# Patient Record
Sex: Female | Born: 2000 | Race: Black or African American | Hispanic: No | Marital: Single | Smoking: Current some day smoker
Health system: Southern US, Community
[De-identification: ages and names within clinical notes are randomized; demographics above are authoritative.]

## PROBLEM LIST (undated history)

## (undated) ENCOUNTER — Ambulatory Visit: Payer: Self-pay | Source: Home / Self Care

## (undated) DIAGNOSIS — Z789 Other specified health status: Secondary | ICD-10-CM

## (undated) HISTORY — PX: NO PAST SURGERIES: SHX2092

## (undated) HISTORY — DX: Other specified health status: Z78.9

---
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
## 2019-04-19 ENCOUNTER — Telehealth: Payer: Self-pay | Admitting: Family Medicine

## 2019-04-19 NOTE — Telephone Encounter (Signed)
Received after-hours page.  Call patient back.  No answer.

## 2019-12-11 ENCOUNTER — Other Ambulatory Visit: Payer: Self-pay

## 2019-12-25 ENCOUNTER — Other Ambulatory Visit: Payer: Self-pay

## 2020-12-17 ENCOUNTER — Ambulatory Visit (HOSPITAL_COMMUNITY)
Admission: EM | Admit: 2020-12-17 | Discharge: 2020-12-17 | Disposition: A | Discharge status: Home / Self Care | Payer: Medicaid Other | Attending: Physician Assistant | Admitting: Physician Assistant

## 2020-12-17 ENCOUNTER — Other Ambulatory Visit: Payer: Self-pay

## 2020-12-17 ENCOUNTER — Encounter (HOSPITAL_COMMUNITY): Payer: Self-pay | Admitting: Emergency Medicine

## 2020-12-17 ENCOUNTER — Ambulatory Visit (INDEPENDENT_AMBULATORY_CARE_PROVIDER_SITE_OTHER): Payer: Medicaid Other

## 2020-12-17 DIAGNOSIS — R0781 Pleurodynia: Secondary | ICD-10-CM | POA: Diagnosis not present

## 2020-12-17 DIAGNOSIS — Z3202 Encounter for pregnancy test, result negative: Secondary | ICD-10-CM

## 2020-12-17 DIAGNOSIS — R0782 Intercostal pain: Secondary | ICD-10-CM | POA: Diagnosis not present

## 2020-12-17 DIAGNOSIS — R079 Chest pain, unspecified: Secondary | ICD-10-CM | POA: Diagnosis not present

## 2020-12-17 LAB — POC URINE PREG, ED: Preg Test, Ur: NEGATIVE

## 2020-12-17 MED ORDER — PREDNISONE 20 MG PO TABS: 40.0000 mg | ORAL_TABLET | Freq: Every day | ORAL | 0 refills | Status: AC

## 2020-12-17 NOTE — ED Triage Notes (Signed)
Pt c/o right rib pain for couple days. Denies injury, coughing, or recent vomiting.  Pt reports keeps spotting after cycles. Has Nexplonon and expired in May.

## 2020-12-17 NOTE — ED Provider Notes (Signed)
[facility]    CSN: [id number] Arrival date & time: 12/17/20  1139      History   Chief Complaint Chief Complaint  Patient presents with   Chest Pain    HPI Lisa Booth is a 20 y.o. female.   Patient presents today with a 3 day history of right chest/rib pain.  She denies any known injury or increase in activity prior to symptom onset.  Reports pain is rated 9 on a 0-10 pain scale, localized to right inferior lateral rib cage, described as sharp, worse with sneezing/coughing.  She has not tried any over-the-counter medications for symptom management.  She reports pain is interfering with her ability perform daily tibias.  Pain is not superficial and she has no tenderness palpation; reports that she feels that it is deep.  She has no concern for pregnancy but is open to testing.  She has a Nexplanon but does not take any exogenous estrogen.  She denies personal or family history of VTE event.  Denies any recent hospitalization, COVID-19 infection, immobilization.  She denies any significant shortness of breath, nausea, vomiting, headache, dizziness.   Past Medical History:  Diagnosis Date   Medical history non-contributory     Patient Active Problem List   Diagnosis Date Noted   Normal labor and delivery 08/11/2017   SVD (spontaneous vaginal delivery) 08/11/2017   Fetal cardiac anomaly affecting pregnancy, antepartum 08/01/2017   Supervision of normal first teen pregnancy 07/24/2017   Limited prenatal care in third trimester 07/24/2017   High risk teen pregnancy in third trimester 07/24/2017    Past Surgical History:  Procedure Laterality Date   NO PAST SURGERIES      OB History     Gravida  1   Para  1   Term  1   Preterm      AB      Living  1      SAB      IAB      Ectopic      Multiple  0   Live Births  1            Home Medications    Prior to Admission medications   Medication Sig Start Date End Date Taking? Authorizing  Provider  predniSONE (DELTASONE) 20 MG tablet Take 2 tablets (40 mg total) by mouth daily for 4 days. 12/17/20 12/21/20 Yes Zackory Pudlo, Noberto Retort, PA-C  Prenatal MV & Min w/FA-DHA (PRENATAL ADULT GUMMY/DHA/FA) 0.4-25 MG CHEW Chew 2 tablets by mouth daily.    [provider]    Family History History reviewed. No pertinent family history.  Social History Social History   Tobacco Use   Smoking status: Former   Smokeless tobacco: Never  Substance Use Topics   Alcohol use: Never   Drug use: Yes    Types: Marijuana    Comment: denies     Allergies   Patient has no known allergies.   Review of Systems Review of Systems  Constitutional:  Positive for activity change. Negative for appetite change, diaphoresis, fatigue and fever.  Respiratory:  Negative for cough and shortness of breath.   Cardiovascular:  Positive for chest pain (right rib). Negative for palpitations and leg swelling.  Gastrointestinal:  Negative for abdominal pain, diarrhea, nausea and vomiting.  Neurological:  Negative for dizziness, weakness, light-headedness and headaches.    Physical Exam Triage Vital Signs ED Triage Vitals  Enc Vitals Group     BP 12/17/20 1317 107/70  Pulse Rate 12/17/20 1317 80     Resp 12/17/20 1317 16     Temp 12/17/20 1317 99.1 F (37.3 C)     Temp Source 12/17/20 1317 Oral     SpO2 12/17/20 1317 98 %     Weight --      Height --      Head Circumference --      Peak Flow --      Pain Score 12/17/20 1315 9     Pain Loc --      Pain Edu? --      Excl. in GC? --    No data found.  Updated Vital Signs BP 107/70 (BP Location: Right Arm)   Pulse 80   Temp 99.1 F (37.3 C) (Oral)   Resp 16   SpO2 98%   Visual Acuity Right Eye Distance:   Left Eye Distance:   Bilateral Distance:    Right Eye Near:   Left Eye Near:    Bilateral Near:     Physical Exam Vitals reviewed.  Constitutional:      General: She is awake. She is not in acute distress.    Appearance:  Normal appearance. She is well-developed. She is not ill-appearing.     Comments: Very pleasant female appears stated age no acute distress sitting comfortably in exam room  HENT:     Head: Normocephalic and atraumatic.     Mouth/Throat:     Pharynx: Uvula midline. No oropharyngeal exudate or posterior oropharyngeal erythema.  Cardiovascular:     Rate and Rhythm: Normal rate and regular rhythm.     Heart sounds: Normal heart sounds, S1 normal and S2 normal. No murmur heard. Pulmonary:     Effort: Pulmonary effort is normal.     Breath sounds: Normal breath sounds. No wheezing, rhonchi or rales.     Comments: Clear to auscultation bilaterally Chest:     Chest wall: No deformity, swelling or tenderness.     Comments: Pain is not reproducible on exam.  No significant tenderness palpation over chest wall.  No deformity noted. Abdominal:     Palpations: Abdomen is soft.     Tenderness: There is no abdominal tenderness. There is no rebound.  Psychiatric:        Behavior: Behavior is cooperative.     UC Treatments / Results  Labs (all labs ordered are listed, but only abnormal results are displayed) Labs Reviewed  POC URINE PREG, ED    EKG   Radiology DG Ribs Unilateral W/Chest Right  Result Date: 12/17/2020 CLINICAL DATA:  Right rib pain, no known injury EXAM: RIGHT RIBS AND CHEST - 3+ VIEW COMPARISON:  None. FINDINGS: No fracture or other bone lesions are seen involving the ribs. There is no evidence of pneumothorax or pleural effusion. Both lungs are clear. Heart size and mediastinal contours are within normal limits. IMPRESSION: No displaced rib fractures or other radiographic abnormality to explain pain. No acute abnormality of the lungs. Electronically Signed   By: Lauralyn Primes M.D.   On: 12/17/2020 14:08    Procedures Procedures (including critical care time)  Medications Ordered in UC Medications - No data to display  Initial Impression / Assessment and Plan / UC Course   I have reviewed the triage vital signs and the nursing notes.  Pertinent labs & imaging results that were available during my care of the patient were reviewed by me and considered in my medical decision making (see chart for details).  EKG obtained showed no ischemic changes with normal sinus rhythm and ventricular rate of 68 bpm; no previous to compare.  Chest x-ray was normal.  Urine pregnancy test was negative.  Discussed that symptoms could be related to pleuritis so we will start prednisone to help manage symptoms.  She was instructed not to take NSAIDs with this medication due to risk of GI bleeding.  Vital signs are reassuring today and patient has no risk factors for PE but we discussed if she has any worsening symptoms including palpitations, chest pain, shortness of breath she is to go to the emergency room for further evaluation and management.  Discussed alarm symptoms that warrant emergent evaluation.  Strict return precautions given to which she expressed understanding.  Final Clinical Impressions(s) / UC Diagnoses   Final diagnoses:  Right-sided chest pain     Discharge Instructions      Your EKG and chest x-ray were normal.  I believe that you have some inflammation of the lining of your lungs which could be causing your pain.  We are going to treat this with an anti-inflammatory known as prednisone.  Please take this morning for 4 days.  Do not take NSAIDs including aspirin, ibuprofen/Advil, naproxen/Aleve with this medication.  If your symptoms do not improve or if at any point you have worsening chest pain, shortness of breath you need to go to the emergency room.  Follow-up with your primary care provider within a week to ensure symptom improvement unless need to be seen in the emergency room sooner.  Call OB/GYN to schedule an appointment to have Nexplanon removed/replaced.     ED Prescriptions     Medication Sig Dispense Auth. Provider   predniSONE (DELTASONE)  20 MG tablet Take 2 tablets (40 mg total) by mouth daily for 4 days. 8 tablet Midge Momon, Noberto Retort, PA-C      PDMP not reviewed this encounter.   Jeani Hawking, PA-C 12/17/20 1502

## 2020-12-17 NOTE — ED Notes (Signed)
Discharged by other. 

## 2020-12-17 NOTE — Discharge Instructions (Addendum)
Your EKG and chest x-ray were normal.  I believe that you have some inflammation of the lining of your lungs which could be causing your pain.  We are going to treat this with an anti-inflammatory known as prednisone.  Please take this morning for 4 days.  Do not take NSAIDs including aspirin, ibuprofen/Advil, naproxen/Aleve with this medication.  If your symptoms do not improve or if at any point you have worsening chest pain, shortness of breath you need to go to the emergency room.  Follow-up with your primary care provider within a week to ensure symptom improvement unless need to be seen in the emergency room sooner.  Call OB/GYN to schedule an appointment to have Nexplanon removed/replaced.

## 2020-12-22 ENCOUNTER — Other Ambulatory Visit: Payer: Self-pay

## 2020-12-22 ENCOUNTER — Emergency Department (HOSPITAL_COMMUNITY)
Admission: EM | Admit: 2020-12-22 | Discharge: 2020-12-23 | Disposition: A | Discharge status: Home / Self Care | Payer: Medicaid Other | Attending: Emergency Medicine | Admitting: Emergency Medicine

## 2020-12-22 ENCOUNTER — Encounter (HOSPITAL_COMMUNITY): Payer: Self-pay

## 2020-12-22 DIAGNOSIS — X58XXXA Exposure to other specified factors, initial encounter: Secondary | ICD-10-CM | POA: Diagnosis not present

## 2020-12-22 DIAGNOSIS — R1011 Right upper quadrant pain: Secondary | ICD-10-CM | POA: Diagnosis not present

## 2020-12-22 DIAGNOSIS — R109 Unspecified abdominal pain: Secondary | ICD-10-CM | POA: Insufficient documentation

## 2020-12-22 DIAGNOSIS — S299XXA Unspecified injury of thorax, initial encounter: Secondary | ICD-10-CM | POA: Diagnosis not present

## 2020-12-22 DIAGNOSIS — Z5321 Procedure and treatment not carried out due to patient leaving prior to being seen by health care provider: Secondary | ICD-10-CM | POA: Insufficient documentation

## 2020-12-22 DIAGNOSIS — R0781 Pleurodynia: Secondary | ICD-10-CM | POA: Diagnosis not present

## 2020-12-22 LAB — URINALYSIS, ROUTINE W REFLEX MICROSCOPIC
Bilirubin Urine: NEGATIVE
Glucose, UA: NEGATIVE mg/dL
Hgb urine dipstick: NEGATIVE
Ketones, ur: 5 mg/dL — AB
Nitrite: NEGATIVE
Protein, ur: NEGATIVE mg/dL
Specific Gravity, Urine: 1.025 (ref 1.005–1.030)
pH: 6 (ref 5.0–8.0)

## 2020-12-22 LAB — CBC WITH DIFFERENTIAL/PLATELET
Abs Immature Granulocytes: 0.06 10*3/uL (ref 0.00–0.07)
Basophils Absolute: 0.1 10*3/uL (ref 0.0–0.1)
Basophils Relative: 1 %
Eosinophils Absolute: 0.1 10*3/uL (ref 0.0–0.5)
Eosinophils Relative: 1 %
HCT: 40 % (ref 36.0–46.0)
Hemoglobin: 13.4 g/dL (ref 12.0–15.0)
Immature Granulocytes: 1 %
Lymphocytes Relative: 24 %
Lymphs Abs: 2.8 10*3/uL (ref 0.7–4.0)
MCH: 32.8 pg (ref 26.0–34.0)
MCHC: 33.5 g/dL (ref 30.0–36.0)
MCV: 97.8 fL (ref 80.0–100.0)
Monocytes Absolute: 1.1 10*3/uL — ABNORMAL HIGH (ref 0.1–1.0)
Monocytes Relative: 9 %
Neutro Abs: 7.9 10*3/uL — ABNORMAL HIGH (ref 1.7–7.7)
Neutrophils Relative %: 64 %
Platelets: 367 10*3/uL (ref 150–400)
RBC: 4.09 MIL/uL (ref 3.87–5.11)
RDW: 12.2 % (ref 11.5–15.5)
WBC: 12 10*3/uL — ABNORMAL HIGH (ref 4.0–10.5)
nRBC: 0 % (ref 0.0–0.2)

## 2020-12-22 LAB — BASIC METABOLIC PANEL
Anion gap: 10 (ref 5–15)
BUN: 12 mg/dL (ref 6–20)
CO2: 25 mmol/L (ref 22–32)
Calcium: 8.8 mg/dL — ABNORMAL LOW (ref 8.9–10.3)
Chloride: 100 mmol/L (ref 98–111)
Creatinine, Ser: 0.73 mg/dL (ref 0.44–1.00)
GFR, Estimated: >60 mL/min (ref >60)
Glucose, Bld: 87 mg/dL (ref 70–99)
Potassium: 2.9 mmol/L — ABNORMAL LOW (ref 3.5–5.1)
Sodium: 135 mmol/L (ref 135–145)

## 2020-12-22 LAB — I-STAT BETA HCG BLOOD, ED (MC, WL, AP ONLY): I-stat hCG, quantitative: <5 m[IU]/mL (ref <5)

## 2020-12-22 LAB — LACTIC ACID, PLASMA: Lactic Acid, Venous: 0.9 mmol/L (ref 0.5–1.9)

## 2020-12-22 LAB — LIPASE, BLOOD: Lipase: 33 U/L (ref 11–51)

## 2020-12-22 NOTE — ED Provider Notes (Signed)
Emergency Medicine Provider Triage Evaluation Note  Lisa Booth , a 20 y.o. female  was evaluated in triage.  Pt complains of right-sided rib pain over the last 5 days.  She was initially seen and evaluated urgent care and had negative rib x-rays and heart work-up per the patient.  She reports associated nausea denies any vomiting, chills, fever.  No urinary complaints or vaginal complaints.  Review of Systems  Positive:  Negative: See above   Physical Exam  BP 111/82 (BP Location: Right Arm)   Pulse (!) 103   Temp (!) 100.4 F (38 C) (Oral)   Resp 17   Ht 5\' 4"  (1.626 m)   Wt 45.8 kg   LMP 12/08/2020 (Approximate)   SpO2 96%   BMI 17.34 kg/m  Gen:   Awake, no distress   Resp:  Normal effort  MSK:   Moves extremities without difficulty  Other:  Moderate tenderness in the right upper quadrant with minimal palpation with mild tenderness throughout the rest the abdomen.  Negative peritoneal signs at this time.  Medical Decision Making  Medically screening exam initiated at 9:51 PM.  Appropriate orders placed.  MISKI FELDPAUSCH was informed that the remainder of the evaluation will be completed by another provider, this initial triage assessment does not replace that evaluation, and the importance of remaining in the ED until their evaluation is complete.     Lisa Booth 12/22/20 2153    2154, MD 12/22/20 2224

## 2020-12-22 NOTE — ED Triage Notes (Addendum)
Pt reports right rib cage pain xd 4 days; was seen at Urgent Care 5 days ago and prescribed a pain med but cant remember what the name of the med. Took last dose last night and the pain is not getting better. PA at triage and patient is tender to palpation to right abdomen.

## 2020-12-23 MED ORDER — ACETAMINOPHEN 325 MG PO TABS
650.0000 mg | ORAL_TABLET | Freq: Once | ORAL | Status: AC
  Administered 2020-12-23: 650 mg via ORAL
  Filled 2020-12-23: qty 2

## 2020-12-23 NOTE — ED Notes (Signed)
Pt is requesting pain medication. 

## 2020-12-23 NOTE — ED Notes (Signed)
Pt left. 

## 2020-12-23 NOTE — ED Notes (Signed)
Name called for IV start x 1 - no answer

## 2020-12-26 ENCOUNTER — Telehealth: Payer: Self-pay

## 2020-12-26 NOTE — Telephone Encounter (Signed)
Transition Care Management Unsuccessful Follow-up Telephone Call  Date of discharge and from where:  12/23/2020-[facility]   Attempts:  1st Attempt  Reason for unsuccessful TCM follow-up call:  Unable to leave message

## 2020-12-27 NOTE — Telephone Encounter (Signed)
Transition Care Management Follow-up Telephone Call Date of discharge and from where: [facility] How have you been since you were released from the hospital? Doing much better Any questions or concerns? No  Items Reviewed: Did the pt receive and understand the discharge instructions provided? Yes  Medications obtained and verified? Yes  Other? No  Any new allergies since your discharge? No  Dietary orders reviewed? Yes Do you have support at home? Yes   Home Care and Equipment/Supplies: Were home health services ordered? not applicable If so, what is the name of the agency?   Has the agency set up a time to come to the patient's home? not applicable Were any new equipment or medical supplies ordered?   What is the name of the medical supply agency?  Were you able to get the supplies/equipment? not applicable Do you have any questions related to the use of the equipment or supplies? No  Functional Questionnaire: (I = Independent and D = Dependent) ADLs: I  Bathing/Dressing- I  Meal Prep- I    Eating- I  Maintaining continence- I  Transferring/Ambulation- I  Managing Meds- I  Follow up appointments reviewed:  PCP Hospital f/u appt confirmed? Yes  I advised patient to find out which PCP was on her card and call them to follow up. Pt agreed.  Specialist Hospital f/u appt confirmed? N/A Are transportation arrangements needed? No  If their condition worsens, is the pt aware to call PCP or go to the Emergency Dept.? Yes Was the patient provided with contact information for the PCP's office or ED? Yes Was to pt encouraged to call back with questions or concerns? Yes

## 2021-06-01 ENCOUNTER — Other Ambulatory Visit: Payer: Self-pay

## 2021-06-01 ENCOUNTER — Encounter: Payer: Self-pay | Admitting: Emergency Medicine

## 2021-06-01 ENCOUNTER — Ambulatory Visit
Admission: EM | Admit: 2021-06-01 | Discharge: 2021-06-01 | Disposition: A | Discharge status: Home / Self Care | Payer: Medicaid Other | Attending: Physician Assistant | Admitting: Physician Assistant

## 2021-06-01 DIAGNOSIS — Z202 Contact with and (suspected) exposure to infections with a predominantly sexual mode of transmission: Secondary | ICD-10-CM | POA: Diagnosis not present

## 2021-06-01 DIAGNOSIS — Z113 Encounter for screening for infections with a predominantly sexual mode of transmission: Secondary | ICD-10-CM

## 2021-06-01 LAB — POCT URINE PREGNANCY: Preg Test, Ur: NEGATIVE

## 2021-06-01 NOTE — ED Triage Notes (Signed)
Here for urine pregnancy as well as STD swabs, declines wanting blood work.  ?

## 2021-06-01 NOTE — ED Provider Notes (Signed)
?[name] ? ? ? ?CSN: [id number] ?Arrival date & time: 06/01/21  1248 ? ? ?  ? ?History   ?Chief Complaint ?No chief complaint on file. ? ? ?HPI ?Lisa Booth is a 21 y.o. female.  ? ?Here today for STD screening.  She also requests urine pregnancy test if possible.  She declines blood work at this time.  She denies any current symptoms or known exposure.  ? ?The history is provided by the patient.  ? ?Past Medical History:  ?Diagnosis Date  ? Medical history non-contributory   ? ? ?Patient Active Problem List  ? Diagnosis Date Noted  ? Normal labor and delivery 08/11/2017  ? SVD (spontaneous vaginal delivery) 08/11/2017  ? Fetal cardiac anomaly affecting pregnancy, antepartum 08/01/2017  ? Supervision of normal first teen pregnancy 07/24/2017  ? Limited prenatal care in third trimester 07/24/2017  ? High risk teen pregnancy in third trimester 07/24/2017  ? ? ?Past Surgical History:  ?Procedure Laterality Date  ? NO PAST SURGERIES    ? ? ?OB History   ? ? Gravida  ?1  ? Para  ?1  ? Term  ?1  ? Preterm  ?   ? AB  ?   ? Living  ?1  ?  ? ? SAB  ?   ? IAB  ?   ? Ectopic  ?   ? Multiple  ?0  ? Live Births  ?1  ?   ?  ?  ? ? ? ?Home Medications   ? ?Prior to Admission medications   ?Medication Sig Start Date End Date Taking? Authorizing Provider  ?Prenatal MV & Min w/FA-DHA (PRENATAL ADULT GUMMY/DHA/FA) 0.4-25 MG CHEW Chew 2 tablets by mouth daily.    [provider]  ? ? ?Family History ?History reviewed. No pertinent family history. ? ?Social History ?Social History  ? ?Tobacco Use  ? Smoking status: Former  ? Smokeless tobacco: Never  ?Substance Use Topics  ? Alcohol use: Never  ? Drug use: Yes  ?  Types: Marijuana  ?  Comment: denies  ? ? ? ?Allergies   ?Patient has no known allergies. ? ? ?Review of Systems ?Review of Systems  ?Constitutional:  Negative for chills and fever.  ?Eyes:  Negative for discharge and redness.  ?Gastrointestinal:  Negative for abdominal pain, nausea and vomiting.   ?Genitourinary:  Negative for vaginal discharge.  ? ? ?Physical Exam ?Triage Vital Signs ?ED Triage Vitals  ?Enc Vitals Group  ?   BP   ?   Pulse   ?   Resp   ?   Temp   ?   Temp src   ?   SpO2   ?   Weight   ?   Height   ?   Head Circumference   ?   Peak Flow   ?   Pain Score   ?   Pain Loc   ?   Pain Edu?   ?   Excl. in GC?   ? ?No data found. ? ?Updated Vital Signs ?BP 115/72 (BP Location: Right Arm)   Pulse 94   Temp 98.2 ?F (36.8 ?C) (Oral)   Resp 16   SpO2 99%  ? ?Physical Exam ?Vitals and nursing note reviewed.  ?Constitutional:   ?   General: She is not in acute distress. ?   Appearance: Normal appearance. She is not ill-appearing.  ?HENT:  ?   Head: Normocephalic and atraumatic.  ?Eyes:  ?  Conjunctiva/sclera: Conjunctivae normal.  ?Cardiovascular:  ?   Rate and Rhythm: Normal rate.  ?Pulmonary:  ?   Effort: Pulmonary effort is normal.  ?Neurological:  ?   Mental Status: She is alert.  ?Psychiatric:     ?   Mood and Affect: Mood normal.     ?   Behavior: Behavior normal.     ?   Thought Content: Thought content normal.  ? ? ? ?UC Treatments / Results  ?Labs ?(all labs ordered are listed, but only abnormal results are displayed) ?Labs Reviewed  ?POCT URINE PREGNANCY  ?CERVICOVAGINAL ANCILLARY ONLY  ? ? ?EKG ? ? ?Radiology ?No results found. ? ?Procedures ?Procedures (including critical care time) ? ?Medications Ordered in UC ?Medications - No data to display ? ?Initial Impression / Assessment and Plan / UC Course  ?I have reviewed the triage vital signs and the nursing notes. ? ?Pertinent labs & imaging results that were available during my care of the patient were reviewed by me and considered in my medical decision making (see chart for details). ? ?  ?STD screening ordered. Urine pregnancy test negative. Will await results for further recommendation. Encouraged follow up with any further concerns.  ? ?Final Clinical Impressions(s) / UC Diagnoses  ? ?Final diagnoses:  ?Screening for STD (sexually  transmitted disease)  ? ?Discharge Instructions   ?None ?  ? ?ED Prescriptions   ?None ?  ? ?PDMP not reviewed this encounter. ?  ?Tomi Bamberger, PA-C ?06/01/21 1403 ? ?

## 2021-06-02 ENCOUNTER — Telehealth (HOSPITAL_COMMUNITY): Payer: Self-pay | Admitting: Emergency Medicine

## 2021-06-02 LAB — CERVICOVAGINAL ANCILLARY ONLY
Bacterial Vaginitis (gardnerella): POSITIVE — AB
Candida Glabrata: NEGATIVE
Candida Vaginitis: NEGATIVE
Chlamydia: POSITIVE — AB
Comment: NEGATIVE
Comment: NEGATIVE
Comment: NEGATIVE
Comment: NEGATIVE
Comment: NEGATIVE
Comment: NORMAL
Neisseria Gonorrhea: POSITIVE — AB
Trichomonas: POSITIVE — AB

## 2021-06-02 MED ORDER — METRONIDAZOLE 500 MG PO TABS: 500.0000 mg | ORAL_TABLET | Freq: Two times a day (BID) | ORAL | 0 refills | Status: DC

## 2021-06-02 MED ORDER — DOXYCYCLINE HYCLATE 100 MG PO CAPS: 100.0000 mg | ORAL_CAPSULE | Freq: Two times a day (BID) | ORAL | 0 refills | Status: AC

## 2021-06-02 NOTE — Telephone Encounter (Signed)
Per protocol, patient will need treatment with IM ROcephin 500mg  for positive Gonorrhea.  Will also need treatment with Doxycycline, and Metronidazole.   ?Attempted to reach patient x 1, unable to LVM on all three numbers on chart ?Prescription sent to pharmacy on file ?

## 2021-06-07 ENCOUNTER — Ambulatory Visit
Admission: EM | Admit: 2021-06-07 | Discharge: 2021-06-07 | Disposition: A | Discharge status: Home / Self Care | Payer: Medicaid Other | Attending: Internal Medicine | Admitting: Internal Medicine

## 2021-06-07 ENCOUNTER — Ambulatory Visit: Payer: Self-pay

## 2021-06-07 DIAGNOSIS — Z202 Contact with and (suspected) exposure to infections with a predominantly sexual mode of transmission: Secondary | ICD-10-CM

## 2021-06-07 MED ORDER — CEFTRIAXONE SODIUM 1 G IJ SOLR
0.5000 g | Freq: Once | INTRAMUSCULAR | Status: AC
  Administered 2021-06-07: 0.5 g via INTRAMUSCULAR

## 2021-06-07 NOTE — ED Triage Notes (Signed)
Patient here for nurse visit of Rocehpin 500mg . ?

## 2021-06-30 ENCOUNTER — Ambulatory Visit
Admission: EM | Admit: 2021-06-30 | Discharge: 2021-06-30 | Disposition: A | Discharge status: Home / Self Care | Payer: Medicaid Other | Attending: Student | Admitting: Student

## 2021-06-30 DIAGNOSIS — Z3202 Encounter for pregnancy test, result negative: Secondary | ICD-10-CM | POA: Diagnosis not present

## 2021-06-30 DIAGNOSIS — Z113 Encounter for screening for infections with a predominantly sexual mode of transmission: Secondary | ICD-10-CM | POA: Diagnosis not present

## 2021-06-30 LAB — POCT URINALYSIS DIP (MANUAL ENTRY)
Bilirubin, UA: NEGATIVE
Glucose, UA: NEGATIVE mg/dL
Ketones, POC UA: NEGATIVE mg/dL
Leukocytes, UA: NEGATIVE
Nitrite, UA: NEGATIVE
Spec Grav, UA: 1.02 (ref 1.010–1.025)
Urobilinogen, UA: 2 E.U./dL — AB
pH, UA: 8.5 — AB (ref 5.0–8.0)

## 2021-06-30 LAB — POCT URINE PREGNANCY: Preg Test, Ur: NEGATIVE

## 2021-06-30 NOTE — Discharge Instructions (Addendum)
-  We have sent testing for sexually transmitted infections. We will notify you of any positive results once they are received. If required, we will prescribe any medications you might need. Please refrain from all sexual activity until treatment is complete.  -Seek additional medical attention if you develop fevers/chills, new/worsening abdominal pain, new/worsening vaginal discomfort/discharge, etc.   

## 2021-06-30 NOTE — ED Triage Notes (Signed)
Pt states here for routine sti check and pregnancy testing. Denies sxs or notice of exposure. Also requests pregnancy test.  ? ?Pt states breasts are still producing milk despite child not feeding x 3 years  ?

## 2021-06-30 NOTE — ED Provider Notes (Signed)
?[name] ? ? ? ?CSN: [id number] ?Arrival date & time: 06/30/21  1928 ? ? ?  ? ?History   ?Chief Complaint ?Chief Complaint  ?Patient presents with  ? sti screening  ? ? ?HPI ?Lisa Booth is a 21 y.o. female presenting for routine STI check and pregnancy test.  Denies symptoms.  Does note that her breasts are still producing trace amounts of milk, that she has not breast-fed in 3 years. Denies hematuria, dysuria, frequency, urgency, back pain, n/v/d/abd pain, fevers/chills, abdnormal vaginal discharge. Denies headaches, vision changes, dizziness. Denies new partners. ? ? ?HPI ? ?Past Medical History:  ?Diagnosis Date  ? Medical history non-contributory   ? ? ?Patient Active Problem List  ? Diagnosis Date Noted  ? Normal labor and delivery 08/11/2017  ? SVD (spontaneous vaginal delivery) 08/11/2017  ? Fetal cardiac anomaly affecting pregnancy, antepartum 08/01/2017  ? Supervision of normal first teen pregnancy 07/24/2017  ? Limited prenatal care in third trimester 07/24/2017  ? High risk teen pregnancy in third trimester 07/24/2017  ? ? ?Past Surgical History:  ?Procedure Laterality Date  ? NO PAST SURGERIES    ? ? ?OB History   ? ? Gravida  ?1  ? Para  ?1  ? Term  ?1  ? Preterm  ?   ? AB  ?   ? Living  ?1  ?  ? ? SAB  ?   ? IAB  ?   ? Ectopic  ?   ? Multiple  ?0  ? Live Births  ?1  ?   ?  ?  ? ? ? ?Home Medications   ? ?Prior to Admission medications   ?Not on File  ? ? ?Family History ?History reviewed. No pertinent family history. ? ?Social History ?Social History  ? ?Tobacco Use  ? Smoking status: Former  ? Smokeless tobacco: Never  ?Substance Use Topics  ? Alcohol use: Never  ? Drug use: Yes  ?  Types: Marijuana  ?  Comment: denies  ? ? ? ?Allergies   ?Patient has no known allergies. ? ? ?Review of Systems ?Review of Systems  ?Constitutional:  Negative for chills and fever.  ?HENT:  Negative for sore throat.   ?Eyes:  Negative for pain and redness.  ?Respiratory:  Negative for shortness of  breath.   ?Cardiovascular:  Negative for chest pain.  ?Gastrointestinal:  Negative for abdominal pain, diarrhea, nausea and vomiting.  ?Genitourinary:  Negative for decreased urine volume, difficulty urinating, dysuria, flank pain, frequency, genital sores, hematuria, urgency and vaginal discharge.  ?Musculoskeletal:  Negative for back pain.  ?Skin:  Negative for rash.  ?All other systems reviewed and are negative. ? ? ?Physical Exam ?Triage Vital Signs ?ED Triage Vitals [06/30/21 1934]  ?Enc Vitals Group  ?   BP 107/68  ?   Pulse Rate 85  ?   Resp 18  ?   Temp 98 ?F (36.7 ?C)  ?   Temp Source Oral  ?   SpO2 98 %  ?   Weight   ?   Height   ?   Head Circumference   ?   Peak Flow   ?   Pain Score 0  ?   Pain Loc   ?   Pain Edu?   ?   Excl. in GC?   ? ?No data found. ? ?Updated Vital Signs ?BP 107/68 (BP Location: Left Arm)   Pulse 85   Temp 98 ?F (36.7 ?C) (Oral)  Resp 18   SpO2 98%   Breastfeeding No  ? ?Visual Acuity ?Right Eye Distance:   ?Left Eye Distance:   ?Bilateral Distance:   ? ?Right Eye Near:   ?Left Eye Near:    ?Bilateral Near:    ? ?Physical Exam ?Vitals reviewed.  ?Constitutional:   ?   General: She is not in acute distress. ?   Appearance: Normal appearance. She is not ill-appearing.  ?HENT:  ?   Head: Normocephalic and atraumatic.  ?   Mouth/Throat:  ?   Mouth: Mucous membranes are moist.  ?   Comments: Moist mucous membranes ?Eyes:  ?   Extraocular Movements: Extraocular movements intact.  ?   Pupils: Pupils are equal, round, and reactive to light.  ?Cardiovascular:  ?   Rate and Rhythm: Normal rate and regular rhythm.  ?   Heart sounds: Normal heart sounds.  ?Pulmonary:  ?   Effort: Pulmonary effort is normal.  ?   Breath sounds: Normal breath sounds. No wheezing, rhonchi or rales.  ?Abdominal:  ?   General: Bowel sounds are normal. There is no distension.  ?   Palpations: Abdomen is soft. There is no mass.  ?   Tenderness: There is no abdominal tenderness. There is no right CVA tenderness,  left CVA tenderness, guarding or rebound.  ?Genitourinary: ?   Comments: deferred ?Skin: ?   General: Skin is warm.  ?   Capillary Refill: Capillary refill takes less than 2 seconds.  ?   Comments: Good skin turgor  ?Neurological:  ?   General: No focal deficit present.  ?   Mental Status: She is alert and oriented to person, place, and time.  ?Psychiatric:     ?   Mood and Affect: Mood normal.     ?   Behavior: Behavior normal.  ? ? ? ?UC Treatments / Results  ?Labs ?(all labs ordered are listed, but only abnormal results are displayed) ?Labs Reviewed  ?POCT URINALYSIS DIP (MANUAL ENTRY) - Abnormal; Notable for the following components:  ?    Result Value  ? Color, UA other (*)   ? Blood, UA trace-intact (*)   ? pH, UA 8.5 (*)   ? Protein Ur, POC trace (*)   ? Urobilinogen, UA 2.0 (*)   ? All other components within normal limits  ?POCT URINE PREGNANCY  ?CERVICOVAGINAL ANCILLARY ONLY  ? ? ?EKG ? ? ?Radiology ?No results found. ? ?Procedures ?Procedures (including critical care time) ? ?Medications Ordered in UC ?Medications - No data to display ? ?Initial Impression / Assessment and Plan / UC Course  ?I have reviewed the triage vital signs and the nursing notes. ? ?Pertinent labs & imaging results that were available during my care of the patient were reviewed by me and considered in my medical decision making (see chart for details). ? ?  ? ?This patient is a very pleasant 21 y.o. year old female presenting with STI screen and pregnancy test - asymptomatic.  ? ?U-preg negative. ?Asymptomatic, denies new partners. Will send self-swab for G/C, trich, yeast, BV testing. Declines HIV, RPR. Safe sex precautions.  ? 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  Recommended following up with OB/GYN to check hormone levels. ? ?ED return precautions discussed. Patient verbalizes understanding and  agreement.  ? ?.  ? ?Final Clinical Impressions(s) / UC Diagnoses  ? ?Final diagnoses:  ?  Negative pregnancy test  ?Routine screening for STI (sexually transmitted infection)  ? ? ? ?Discharge Instructions   ? ?  ?-We have sent testing for sexually transmitted infections. We will notify you of any positive results once they are received. If required, we will prescribe any medications you might need. Please refrain from all sexual activity until treatment is complete.  ?-Seek additional medical attention if you develop fevers/chills, new/worsening abdominal pain, new/worsening vaginal discomfort/discharge, etc.  ? ? ? ? ? ?ED Prescriptions   ?None ?  ? ?PDMP not reviewed this encounter. ?  ?Rhys MartiniGraham, Amelda Hapke E, PA-C ?06/30/21 1954 ? ?

## 2021-07-03 ENCOUNTER — Telehealth (HOSPITAL_COMMUNITY): Payer: Self-pay | Admitting: Emergency Medicine

## 2021-07-03 LAB — CERVICOVAGINAL ANCILLARY ONLY
Bacterial Vaginitis (gardnerella): POSITIVE — AB
Candida Glabrata: POSITIVE — AB
Candida Vaginitis: NEGATIVE
Chlamydia: POSITIVE — AB
Comment: NEGATIVE
Comment: NEGATIVE
Comment: NEGATIVE
Comment: NEGATIVE
Comment: NEGATIVE
Comment: NORMAL
Neisseria Gonorrhea: POSITIVE — AB
Trichomonas: POSITIVE — AB

## 2021-07-03 MED ORDER — DOXYCYCLINE HYCLATE 100 MG PO CAPS: 100.0000 mg | ORAL_CAPSULE | Freq: Two times a day (BID) | ORAL | 0 refills | Status: AC

## 2021-07-03 MED ORDER — METRONIDAZOLE 500 MG PO TABS: 500.0000 mg | ORAL_TABLET | Freq: Two times a day (BID) | ORAL | 0 refills | Status: DC

## 2021-07-03 MED ORDER — FLUCONAZOLE 150 MG PO TABS: 150.0000 mg | ORAL_TABLET | Freq: Once | ORAL | 0 refills | Status: AC

## 2021-07-03 NOTE — Telephone Encounter (Signed)
Per protocol, patient will need treatment with IM Rocephin 500mg  for positive Gonorrhea.  Will also need treatment with Doxycycline, Metronidazole and diflucan.   ?Contacted patient by phone.  Verified identity using two identifiers.  Provided positive result.  Reviewed safe sex practices, notifying partners, and refraining from sexual activities for 7 days from time of treatment.  Patient verified understanding, all questions answered.   ?HHS notified ?Verified pharmacy, prescription sent ?

## 2021-07-04 ENCOUNTER — Ambulatory Visit
Admission: RE | Admit: 2021-07-04 | Discharge: 2021-07-04 | Disposition: A | Discharge status: Home / Self Care | Payer: Medicaid Other | Source: Ambulatory Visit | Attending: Physician Assistant | Admitting: Physician Assistant

## 2021-07-04 DIAGNOSIS — A549 Gonococcal infection, unspecified: Secondary | ICD-10-CM | POA: Diagnosis not present

## 2021-07-04 MED ORDER — CEFTRIAXONE SODIUM 1 G IJ SOLR
0.5000 g | Freq: Once | INTRAMUSCULAR | Status: AC
  Administered 2021-07-04: 0.5 g via INTRAMUSCULAR

## 2021-07-11 NOTE — ED Triage Notes (Signed)
Pt presents for treatment for gonorrhea.  ?

## 2021-08-02 ENCOUNTER — Ambulatory Visit: Payer: Medicaid Other | Admitting: Family Medicine

## 2021-08-09 ENCOUNTER — Ambulatory Visit
Admission: EM | Admit: 2021-08-09 | Discharge: 2021-08-09 | Discharge status: Against Medical Advice | Payer: Medicaid Other

## 2021-08-09 ENCOUNTER — Ambulatory Visit
Admission: EM | Admit: 2021-08-09 | Discharge: 2021-08-09 | Disposition: A | Discharge status: Home / Self Care | Payer: Medicaid Other | Attending: Urgent Care | Admitting: Urgent Care

## 2021-08-09 ENCOUNTER — Encounter: Payer: Self-pay | Admitting: Emergency Medicine

## 2021-08-09 DIAGNOSIS — A749 Chlamydial infection, unspecified: Secondary | ICD-10-CM | POA: Diagnosis not present

## 2021-08-09 DIAGNOSIS — A5901 Trichomonal vulvovaginitis: Secondary | ICD-10-CM | POA: Insufficient documentation

## 2021-08-09 MED ORDER — TINIDAZOLE 500 MG PO TABS: 2.0000 g | ORAL_TABLET | Freq: Every day | ORAL | 0 refills | Status: AC

## 2021-08-09 MED ORDER — AZITHROMYCIN 500 MG PO TABS
1000.0000 mg | ORAL_TABLET | Freq: Once | ORAL | Status: AC
  Administered 2021-08-09: 1000 mg via ORAL

## 2021-08-09 NOTE — Discharge Instructions (Signed)
You received the treatment for gonorrhea on 07/04/21. ?You were given 1g of Azithromycin today to cover for chlamydia which was positive in April. ?You have been prescribed tinidazole to cover for trichomonas and yeast. You will take 4 tabs in a single dose today and 4 tabs in a single dose tomorrow.  ?PLEASE USE [email or web address] for this medication, it is not covered by your prescription plan. ?We will call with the results of your swab ?

## 2021-08-09 NOTE — ED Triage Notes (Signed)
Pt is present today for STD check. Pt denies any sx  

## 2021-08-09 NOTE — ED Provider Notes (Signed)
?[name] ? ? ? ?CSN: [id number] ?Arrival date & time: 08/09/21  1322 ? ? ?  ? ?History   ?Chief Complaint ?Chief Complaint  ?Patient presents with  ? SEXUALLY TRANSMITTED DISEASE  ? ? ?HPI ?TARHONDA HOLLENBERG is a 21 y.o. female.  ? ?21yo female presents today with concerns for untreated STI. Was seen in April, was positive for GC/chlam/trich/BV/ yeast. States she came back on 4/11 and got rocephin. Only took maybe half of the doxy and flagyl, then forgot. Denies any current symptoms, but states she never had any symptoms in April either. Is wanting to receive "shorter treatments" since compliance is an issue.  ? ? ? ?Past Medical History:  ?Diagnosis Date  ? Medical history non-contributory   ? ? ?Patient Active Problem List  ? Diagnosis Date Noted  ? Normal labor and delivery 08/11/2017  ? SVD (spontaneous vaginal delivery) 08/11/2017  ? Fetal cardiac anomaly affecting pregnancy, antepartum 08/01/2017  ? Supervision of normal first teen pregnancy 07/24/2017  ? Limited prenatal care in third trimester 07/24/2017  ? High risk teen pregnancy in third trimester 07/24/2017  ? ? ?Past Surgical History:  ?Procedure Laterality Date  ? NO PAST SURGERIES    ? ? ?OB History   ? ? Gravida  ?1  ? Para  ?1  ? Term  ?1  ? Preterm  ?   ? AB  ?   ? Living  ?1  ?  ? ? SAB  ?   ? IAB  ?   ? Ectopic  ?   ? Multiple  ?0  ? Live Births  ?1  ?   ?  ?  ? ? ? ?Home Medications   ? ?Prior to Admission medications   ?Medication Sig Start Date End Date Taking? Authorizing Provider  ?tinidazole (TINDAMAX) 500 MG tablet Take 4 tablets (2,000 mg total) by mouth daily with breakfast for 2 days. 08/09/21 08/11/21 Yes [place] Cilento L, PA  ? ? ?Family History ?History reviewed. No pertinent family history. ? ?Social History ?Social History  ? ?Tobacco Use  ? Smoking status: Former  ? Smokeless tobacco: Never  ?Substance Use Topics  ? Alcohol use: Never  ? Drug use: Yes  ?  Types: Marijuana  ?  Comment: denies  ? ? ? ?Allergies   ?Patient  has no known allergies. ? ? ?Review of Systems ?Review of Systems ?As per hpi ? ?Physical Exam ?Triage Vital Signs ?ED Triage Vitals  ?Enc Vitals Group  ?   BP 08/09/21 1347 108/74  ?   Pulse Rate 08/09/21 1347 95  ?   Resp 08/09/21 1347 16  ?   Temp 08/09/21 1347 98.2 ?F (36.8 ?C)  ?   Temp src --   ?   SpO2 08/09/21 1347 99 %  ?   Weight --   ?   Height --   ?   Head Circumference --   ?   Peak Flow --   ?   Pain Score 08/09/21 1346 0  ?   Pain Loc --   ?   Pain Edu? --   ?   Excl. in GC? --   ? ?No data found. ? ?Updated Vital Signs ?BP 108/74   Pulse 95   Temp 98.2 ?F (36.8 ?C)   Resp 16   LMP 08/07/2021   SpO2 99%  ? ?Visual Acuity ?Right Eye Distance:   ?Left Eye Distance:   ?Bilateral Distance:   ? ?Right  Eye Near:   ?Left Eye Near:    ?Bilateral Near:    ? ?Physical Exam ?Vitals and nursing note reviewed.  ?Constitutional:   ?   General: She is not in acute distress. ?   Appearance: Normal appearance. She is normal weight. She is not ill-appearing, toxic-appearing or diaphoretic.  ?HENT:  ?   Head: Normocephalic and atraumatic.  ?Abdominal:  ?   General: Abdomen is flat. Bowel sounds are normal. There is no distension.  ?   Palpations: Abdomen is soft.  ?   Tenderness: There is no abdominal tenderness. There is no right CVA tenderness, left CVA tenderness or guarding.  ?Lymphadenopathy:  ?   Cervical: No cervical adenopathy.  ?Skin: ?   General: Skin is warm and dry.  ?   Capillary Refill: Capillary refill takes less than 2 seconds.  ?   Findings: No erythema or rash.  ?Neurological:  ?   Mental Status: She is alert.  ? ? ? ?UC Treatments / Results  ?Labs ?(all labs ordered are listed, but only abnormal results are displayed) ?Labs Reviewed  ?CERVICOVAGINAL ANCILLARY ONLY  ? ? ?EKG ? ? ?Radiology ?No results found. ? ?Procedures ?Procedures (including critical care time) ? ?Medications Ordered in UC ?Medications  ?azithromycin (ZITHROMAX) tablet 1,000 mg (1,000 mg Oral Given 08/09/21 1429)  ? ? ?Initial  Impression / Assessment and Plan / UC Course  ?I have reviewed the triage vital signs and the nursing notes. ? ?Pertinent labs & imaging results that were available during my care of the patient were reviewed by me and considered in my medical decision making (see chart for details). ? ?  ? ?Chlamydia - pt positive for chlamydia last month, did not complete tx. Is requesting one time dose. Understands this is not the preferred tx, will consider test of cure next week ?Trichomonas - pt did not complete flagyl course in April. Will do two days of tinidazole to help with compliance.  ? ?Final Clinical Impressions(s) / UC Diagnoses  ? ?Final diagnoses:  ?Chlamydia infection  ?Trichomonas vaginitis  ? ? ? ?Discharge Instructions   ? ?  ?You received the treatment for gonorrhea on 07/04/21. ?You were given 1g of Azithromycin today to cover for chlamydia which was positive in April. ?You have been prescribed tinidazole to cover for trichomonas and yeast. You will take 4 tabs in a single dose today and 4 tabs in a single dose tomorrow.  ?PLEASE USE [email or web address] for this medication, it is not covered by your prescription plan. ?We will call with the results of your swab ? ? ? ?ED Prescriptions   ? ? Medication Sig Dispense Auth. Provider  ? tinidazole (TINDAMAX) 500 MG tablet Take 4 tablets (2,000 mg total) by mouth daily with breakfast for 2 days. 8 tablet [place], [place] L, PA  ? ?  ? ?PDMP not reviewed this encounter. ?  Maretta Bees, [place] ?08/09/21 1516 ? ?

## 2021-08-10 ENCOUNTER — Telehealth (HOSPITAL_COMMUNITY): Payer: Self-pay | Admitting: Emergency Medicine

## 2021-08-10 LAB — CERVICOVAGINAL ANCILLARY ONLY
Bacterial Vaginitis (gardnerella): NEGATIVE
Candida Glabrata: POSITIVE — AB
Candida Vaginitis: POSITIVE — AB
Chlamydia: NEGATIVE
Comment: NEGATIVE
Comment: NEGATIVE
Comment: NEGATIVE
Comment: NEGATIVE
Comment: NEGATIVE
Comment: NORMAL
Neisseria Gonorrhea: NEGATIVE
Trichomonas: NEGATIVE

## 2021-08-10 MED ORDER — FLUCONAZOLE 150 MG PO TABS: 150.0000 mg | ORAL_TABLET | Freq: Once | ORAL | 0 refills | Status: AC

## 2021-10-10 ENCOUNTER — Ambulatory Visit
Admission: EM | Admit: 2021-10-10 | Discharge: 2021-10-10 | Disposition: A | Discharge status: Home / Self Care | Payer: Medicaid Other | Attending: Physician Assistant | Admitting: Physician Assistant

## 2021-10-10 ENCOUNTER — Other Ambulatory Visit: Payer: Self-pay

## 2021-10-10 ENCOUNTER — Encounter: Payer: Self-pay | Admitting: Emergency Medicine

## 2021-10-10 DIAGNOSIS — Z202 Contact with and (suspected) exposure to infections with a predominantly sexual mode of transmission: Secondary | ICD-10-CM | POA: Diagnosis present

## 2021-10-10 LAB — POCT URINALYSIS DIP (MANUAL ENTRY)
Bilirubin, UA: NEGATIVE
Glucose, UA: NEGATIVE mg/dL
Ketones, POC UA: NEGATIVE mg/dL
Leukocytes, UA: NEGATIVE
Nitrite, UA: NEGATIVE
Protein Ur, POC: NEGATIVE mg/dL
Spec Grav, UA: 1.025 (ref 1.010–1.025)
Urobilinogen, UA: 0.2 E.U./dL
pH, UA: 5.5 (ref 5.0–8.0)

## 2021-10-10 LAB — POCT URINE PREGNANCY: Preg Test, Ur: NEGATIVE

## 2021-10-10 MED ORDER — CEFTRIAXONE SODIUM 1 G IJ SOLR
0.5000 g | Freq: Once | INTRAMUSCULAR | Status: AC
  Administered 2021-10-10: 0.5 g via INTRAMUSCULAR

## 2021-10-10 MED ORDER — DOXYCYCLINE HYCLATE 100 MG PO CAPS: 100.0000 mg | ORAL_CAPSULE | Freq: Two times a day (BID) | ORAL | 0 refills | Status: DC

## 2021-10-10 NOTE — Discharge Instructions (Addendum)
Return if any problems.

## 2021-10-10 NOTE — ED Triage Notes (Signed)
Spotting 4 days last week started period today.  Patient wants std testing and pregnancy test.

## 2021-10-12 ENCOUNTER — Telehealth (HOSPITAL_COMMUNITY): Payer: Self-pay | Admitting: Emergency Medicine

## 2021-10-12 LAB — CERVICOVAGINAL ANCILLARY ONLY
Bacterial Vaginitis (gardnerella): POSITIVE — AB
Candida Glabrata: NEGATIVE
Candida Vaginitis: NEGATIVE
Chlamydia: POSITIVE — AB
Comment: NEGATIVE
Comment: NEGATIVE
Comment: NEGATIVE
Comment: NEGATIVE
Comment: NEGATIVE
Comment: NORMAL
Neisseria Gonorrhea: NEGATIVE
Trichomonas: NEGATIVE

## 2021-10-12 MED ORDER — METRONIDAZOLE 500 MG PO TABS: 500.0000 mg | ORAL_TABLET | Freq: Two times a day (BID) | ORAL | 0 refills | Status: DC

## 2021-10-12 NOTE — ED Provider Notes (Signed)
[name]    CSN: [id number] Arrival date & time: 10/10/21  1742      History   Chief Complaint Chief Complaint  Patient presents with   SEXUALLY TRANSMITTED DISEASE    HPI Lisa Booth is a 21 y.o. female.   Pt request std testing and a pregnancy test.  Pt is concerned about std exposure  The history is provided by the patient. No language interpreter was used.    Past Medical History:  Diagnosis Date   Medical history non-contributory     Patient Active Problem List   Diagnosis Date Noted   Normal labor and delivery 08/11/2017   SVD (spontaneous vaginal delivery) 08/11/2017   Fetal cardiac anomaly affecting pregnancy, antepartum 08/01/2017   Supervision of normal first teen pregnancy 07/24/2017   Limited prenatal care in third trimester 07/24/2017   High risk teen pregnancy in third trimester 07/24/2017    Past Surgical History:  Procedure Laterality Date   NO PAST SURGERIES      OB History     Gravida  1   Para  1   Term  1   Preterm      AB      Living  1      SAB      IAB      Ectopic      Multiple  0   Live Births  1            Home Medications    Prior to Admission medications   Medication Sig Start Date End Date Taking? Authorizing Provider  doxycycline (VIBRAMYCIN) 100 MG capsule Take 1 capsule (100 mg total) by mouth 2 (two) times daily. 10/10/21  Yes Cheron Schaumann K, PA-C  metroNIDAZOLE (FLAGYL) 500 MG tablet Take 1 tablet (500 mg total) by mouth 2 (two) times daily. 10/12/21   Lamptey, Britta Mccreedy, MD    Family History History reviewed. No pertinent family history.  Social History Social History   Tobacco Use   Smoking status: Former   Smokeless tobacco: Never  Building services engineer Use: Never used  Substance Use Topics   Alcohol use: Yes   Drug use: Yes    Types: Marijuana    Comment: denies     Allergies   Patient has no known allergies.   Review of Systems Review of Systems   Genitourinary:  Positive for vaginal discharge.  All other systems reviewed and are negative.    Physical Exam Triage Vital Signs ED Triage Vitals  Enc Vitals Group     BP 10/10/21 1806 109/67     Pulse Rate 10/10/21 1806 97     Resp 10/10/21 1806 18     Temp 10/10/21 1806 98.6 F (37 C)     Temp Source 10/10/21 1806 Oral     SpO2 10/10/21 1806 98 %     Weight --      Height --      Head Circumference --      Peak Flow --      Pain Score 10/10/21 1803 0     Pain Loc --      Pain Edu? --      Excl. in GC? --    No data found.  Updated Vital Signs BP 109/67 (BP Location: Left Arm)   Pulse 97   Temp 98.6 F (37 C) (Oral)   Resp 18   SpO2 98%   Visual Acuity Right Eye Distance:  Left Eye Distance:   Bilateral Distance:    Right Eye Near:   Left Eye Near:    Bilateral Near:     Physical Exam Vitals and nursing note reviewed.  Constitutional:      Appearance: She is well-developed.  HENT:     Head: Normocephalic.  Cardiovascular:     Rate and Rhythm: Normal rate.  Pulmonary:     Effort: Pulmonary effort is normal.  Abdominal:     General: There is no distension.  Musculoskeletal:        General: Normal range of motion.     Cervical back: Normal range of motion.  Skin:    General: Skin is warm.  Neurological:     General: No focal deficit present.     Mental Status: She is alert and oriented to person, place, and time.      UC Treatments / Results  Labs (all labs ordered are listed, but only abnormal results are displayed) Labs Reviewed  POCT URINALYSIS DIP (MANUAL ENTRY) - Abnormal; Notable for the following components:      Result Value   Blood, UA moderate (*)    All other components within normal limits  CERVICOVAGINAL ANCILLARY ONLY - Abnormal; Notable for the following components:   Chlamydia Positive (*)    Bacterial Vaginitis (gardnerella) Positive (*)    All other components within normal limits  POCT URINE PREGNANCY     EKG   Radiology No results found.  Procedures Procedures (including critical care time)  Medications Ordered in UC Medications  cefTRIAXone (ROCEPHIN) injection 0.5 g (0.5 g Intramuscular Given 10/10/21 1837)    Initial Impression / Assessment and Plan / UC Course  I have reviewed the triage vital signs and the nursing notes.  Pertinent labs & imaging results that were available during my care of the patient were reviewed by me and considered in my medical decision making (see chart for details).      Final Clinical Impressions(s) / UC Diagnoses   Final diagnoses:  STD exposure     Discharge Instructions      Return if any problems.    ED Prescriptions     Medication Sig Dispense Auth. Provider   doxycycline (VIBRAMYCIN) 100 MG capsule Take 1 capsule (100 mg total) by mouth 2 (two) times daily. 20 capsule Elson Areas, [place]      PDMP not reviewed this encounter.   Elson Areas, [place] 10/12/21 2224

## 2021-12-04 ENCOUNTER — Ambulatory Visit
Admission: EM | Admit: 2021-12-04 | Discharge: 2021-12-04 | Disposition: A | Discharge status: Home / Self Care | Payer: Medicaid Other | Attending: Physician Assistant | Admitting: Physician Assistant

## 2021-12-04 DIAGNOSIS — Z202 Contact with and (suspected) exposure to infections with a predominantly sexual mode of transmission: Secondary | ICD-10-CM | POA: Diagnosis not present

## 2021-12-04 DIAGNOSIS — Z113 Encounter for screening for infections with a predominantly sexual mode of transmission: Secondary | ICD-10-CM | POA: Diagnosis not present

## 2021-12-04 LAB — POCT URINE PREGNANCY: Preg Test, Ur: NEGATIVE

## 2021-12-04 MED ORDER — AZITHROMYCIN 500 MG PO TABS
1000.0000 mg | ORAL_TABLET | Freq: Once | ORAL | Status: AC
  Administered 2021-12-04: 1000 mg via ORAL

## 2021-12-04 MED ORDER — AZITHROMYCIN 500 MG PO TABS: 1000.0000 mg | ORAL_TABLET | Freq: Once | ORAL | 0 refills | Status: AC

## 2021-12-04 NOTE — ED Triage Notes (Signed)
Pt presents for STD retesting after she tested positive for BV and chlamydia last month and given treatment pt states she was only able to take 3 days of treatment and she went to jail before she could finish.

## 2021-12-04 NOTE — ED Provider Notes (Signed)
[name]    CSN: [id number] Arrival date & time: 12/04/21  1541      History   Chief Complaint Chief Complaint  Patient presents with   SEXUALLY TRANSMITTED DISEASE    HPI Lisa Booth is a 21 y.o. female.   Patient here today for repeat STD screening.  She reports that she was tested positive for BV and chlamydia last month but was unable to finish her course of treatment due to being incarcerated.  She continues to have symptoms.  She prefers azithromycin to doxycycline if possible for treatment of chlamydia.  The history is provided by the patient.    Past Medical History:  Diagnosis Date   Medical history non-contributory     Patient Active Problem List   Diagnosis Date Noted   Normal labor and delivery 08/11/2017   SVD (spontaneous vaginal delivery) 08/11/2017   Fetal cardiac anomaly affecting pregnancy, antepartum 08/01/2017   Supervision of normal first teen pregnancy 07/24/2017   Limited prenatal care in third trimester 07/24/2017   High risk teen pregnancy in third trimester 07/24/2017    Past Surgical History:  Procedure Laterality Date   NO PAST SURGERIES      OB History     Gravida  1   Para  1   Term  1   Preterm      AB      Living  1      SAB      IAB      Ectopic      Multiple  0   Live Births  1            Home Medications    Prior to Admission medications   Medication Sig Start Date End Date Taking? Authorizing Provider  azithromycin (ZITHROMAX) 500 MG tablet Take 2 tablets (1,000 mg total) by mouth once for 1 dose. 12/04/21 12/04/21 Yes Tomi Bamberger, PA-C  doxycycline (VIBRAMYCIN) 100 MG capsule Take 1 capsule (100 mg total) by mouth 2 (two) times daily. 10/10/21   [place], PA-C  metroNIDAZOLE (FLAGYL) 500 MG tablet Take 1 tablet (500 mg total) by mouth 2 (two) times daily. 10/12/21   Lamptey, Britta Mccreedy, MD    Family History Family History  Family history unknown: Yes    Social  History Social History   Tobacco Use   Smoking status: Former   Smokeless tobacco: Never  Building services engineer Use: Never used  Substance Use Topics   Alcohol use: Yes   Drug use: Yes    Types: Marijuana    Comment: denies     Allergies   Patient has no known allergies.   Review of Systems Review of Systems  Constitutional:  Negative for chills and fever.  Eyes:  Negative for discharge and redness.  Gastrointestinal:  Negative for abdominal pain, nausea and vomiting.  Genitourinary:  Positive for vaginal discharge. Negative for vaginal bleeding.     Physical Exam Triage Vital Signs ED Triage Vitals  Enc Vitals Group     BP 12/04/21 1702 101/65     Pulse Rate 12/04/21 1702 92     Resp 12/04/21 1702 18     Temp 12/04/21 1702 98 F (36.7 C)     Temp Source 12/04/21 1702 Oral     SpO2 12/04/21 1702 98 %     Weight --      Height --      Head Circumference --  Peak Flow --      Pain Score 12/04/21 1709 0     Pain Loc --      Pain Edu? --      Excl. in GC? --    No data found.  Updated Vital Signs BP 101/65 (BP Location: Left Arm)   Pulse 92   Temp 98 F (36.7 C) (Oral)   Resp 18   SpO2 98%      Physical Exam Vitals and nursing note reviewed.  Constitutional:      General: She is not in acute distress.    Appearance: Normal appearance. She is not ill-appearing.  HENT:     Head: Normocephalic and atraumatic.  Eyes:     Conjunctiva/sclera: Conjunctivae normal.  Cardiovascular:     Rate and Rhythm: Normal rate.  Pulmonary:     Effort: Pulmonary effort is normal.  Neurological:     Mental Status: She is alert.  Psychiatric:        Mood and Affect: Mood normal.        Behavior: Behavior normal.        Thought Content: Thought content normal.      UC Treatments / Results  Labs (all labs ordered are listed, but only abnormal results are displayed) Labs Reviewed  POCT URINE PREGNANCY  CERVICOVAGINAL ANCILLARY ONLY     EKG   Radiology No results found.  Procedures Procedures (including critical care time)  Medications Ordered in UC Medications  azithromycin (ZITHROMAX) tablet 1,000 mg (1,000 mg Oral Given 12/04/21 1744)    Initial Impression / Assessment and Plan / UC Course  I have reviewed the triage vital signs and the nursing notes.  Pertinent labs & imaging results that were available during my care of the patient were reviewed by me and considered in my medical decision making (see chart for details).    Discussed that azithromycin was not first-line therapy at this time however patient continues to desire it over doxycycline.  Zithromax given in office, and recommended follow-up with any further concerns.  Will await results for further recommendation.  Final Clinical Impressions(s) / UC Diagnoses   Final diagnoses:  Screening for STD (sexually transmitted disease)   Discharge Instructions   None    ED Prescriptions     Medication Sig Dispense Auth. Provider   azithromycin (ZITHROMAX) 500 MG tablet Take 2 tablets (1,000 mg total) by mouth once for 1 dose. 2 tablet Tomi Bamberger, PA-C      PDMP not reviewed this encounter.   Tomi Bamberger, PA-C 12/04/21 [phone]

## 2021-12-05 LAB — CERVICOVAGINAL ANCILLARY ONLY
Bacterial Vaginitis (gardnerella): POSITIVE — AB
Candida Glabrata: NEGATIVE
Candida Vaginitis: POSITIVE — AB
Chlamydia: NEGATIVE
Comment: NEGATIVE
Comment: NEGATIVE
Comment: NEGATIVE
Comment: NEGATIVE
Comment: NEGATIVE
Comment: NORMAL
Neisseria Gonorrhea: NEGATIVE
Trichomonas: NEGATIVE

## 2021-12-06 ENCOUNTER — Telehealth (HOSPITAL_COMMUNITY): Payer: Self-pay | Admitting: Emergency Medicine

## 2021-12-06 MED ORDER — METRONIDAZOLE 500 MG PO TABS: 500.0000 mg | ORAL_TABLET | Freq: Two times a day (BID) | ORAL | 0 refills | Status: DC

## 2021-12-06 MED ORDER — FLUCONAZOLE 150 MG PO TABS: 150.0000 mg | ORAL_TABLET | Freq: Once | ORAL | 0 refills | Status: AC

## 2021-12-18 ENCOUNTER — Ambulatory Visit: Payer: Medicaid Other | Admitting: Family Medicine

## 2022-03-27 ENCOUNTER — Ambulatory Visit
Admission: EM | Admit: 2022-03-27 | Discharge: 2022-03-27 | Disposition: A | Discharge status: Home / Self Care | Payer: Medicaid Other | Attending: Physician Assistant | Admitting: Physician Assistant

## 2022-03-27 DIAGNOSIS — Z113 Encounter for screening for infections with a predominantly sexual mode of transmission: Secondary | ICD-10-CM | POA: Insufficient documentation

## 2022-03-27 NOTE — ED Provider Notes (Signed)
[name]    CSN: [id number] Arrival date & time: 03/27/22  1542      History   Chief Complaint Chief Complaint  Patient presents with   vaginal odor     HPI Lisa Booth is a 22 y.o. female.   Patient here today for evaluation of vaginal odor she has had the last 2 weeks. She reports that she has not had any changes in discharge. She has not had any known STD exposure. She has not had any vaginal or pelvic pain. She declines blood work for STD screening today.   The history is provided by the patient.    Past Medical History:  Diagnosis Date   Medical history non-contributory     Patient Active Problem List   Diagnosis Date Noted   Normal labor and delivery 08/11/2017   SVD (spontaneous vaginal delivery) 08/11/2017   Fetal cardiac anomaly affecting pregnancy, antepartum 08/01/2017   Supervision of normal first teen pregnancy 07/24/2017   Limited prenatal care in third trimester 07/24/2017   High risk teen pregnancy in third trimester 07/24/2017    Past Surgical History:  Procedure Laterality Date   NO PAST SURGERIES      OB History     Gravida  1   Para  1   Term  1   Preterm      AB      Living  1      SAB      IAB      Ectopic      Multiple  0   Live Births  1            Home Medications    Prior to Admission medications   Medication Sig Start Date End Date Taking? Authorizing Provider  metroNIDAZOLE (FLAGYL) 500 MG tablet Take 1 tablet (500 mg total) by mouth 2 (two) times daily. Patient not taking: Reported on 03/27/2022 12/06/21   Chase Picket, MD  doxycycline (VIBRAMYCIN) 100 MG capsule Take 1 capsule (100 mg total) by mouth 2 (two) times daily. Patient not taking: Reported on 03/27/2022 10/10/21   Sidney Ace    Family History Family History  Family history unknown: Yes    Social History Social History   Tobacco Use   Smoking status: Some Days    Types: Cigars   Smokeless tobacco: Never   Vaping Use   Vaping Use: Never used  Substance Use Topics   Alcohol use: Yes   Drug use: Yes    Types: Marijuana    Comment: every other day     Allergies   Patient has no known allergies.   Review of Systems Review of Systems  Constitutional:  Negative for chills and fever.  Eyes:  Negative for discharge and redness.  Gastrointestinal:  Negative for abdominal pain, nausea and vomiting.  Genitourinary:  Negative for genital sores, vaginal bleeding and vaginal discharge.     Physical Exam Triage Vital Signs ED Triage Vitals  Enc Vitals Group     BP 03/27/22 1701 99/62     Pulse Rate 03/27/22 1700 82     Resp 03/27/22 1700 19     Temp 03/27/22 1700 98 F (36.7 C)     Temp Source 03/27/22 1700 Oral     SpO2 03/27/22 1700 98 %     Weight --      Height --      Head Circumference --      Peak Flow --  Pain Score 03/27/22 1659 0     Pain Loc --      Pain Edu? --      Excl. in [place]? --    No data found.  Updated Vital Signs BP 99/62   Pulse 82   Temp 98 F (36.7 C) (Oral)   Resp 19   SpO2 98%    Physical Exam Vitals and nursing note reviewed.  Constitutional:      General: She is not in acute distress.    Appearance: Normal appearance. She is not ill-appearing.  HENT:     Head: Normocephalic and atraumatic.  Eyes:     Conjunctiva/sclera: Conjunctivae normal.  Cardiovascular:     Rate and Rhythm: Normal rate.  Pulmonary:     Effort: Pulmonary effort is normal.  Neurological:     Mental Status: She is alert.  Psychiatric:        Mood and Affect: Mood normal.        Behavior: Behavior normal.        Thought Content: Thought content normal.      UC Treatments / Results  Labs (all labs ordered are listed, but only abnormal results are displayed) Labs Reviewed  CERVICOVAGINAL ANCILLARY ONLY    EKG   Radiology No results found.  Procedures Procedures (including critical care time)  Medications Ordered in UC Medications - No data to  display  Initial Impression / Assessment and Plan / UC Course  I have reviewed the triage vital signs and the nursing notes.  Pertinent labs & imaging results that were available during my care of the patient were reviewed by me and considered in my medical decision making (see chart for details).    STD screening ordered as well as screening for BV/ yeast. Will await results for further recommendation.   Final Clinical Impressions(s) / UC Diagnoses   Final diagnoses:  Screening for STD (sexually transmitted disease)   Discharge Instructions   None    ED Prescriptions   None    PDMP not reviewed this encounter.   Francene Finders, PA-C 03/27/22 1731

## 2022-03-27 NOTE — ED Triage Notes (Signed)
Pt presents to uc with co of vaginal odor for 2 weeks. Pt reports no pain or changes in discharge. Pt denies any recent pos partners that she knows of. Denies pain

## 2022-03-28 LAB — CERVICOVAGINAL ANCILLARY ONLY
Bacterial Vaginitis (gardnerella): POSITIVE — AB
Candida Glabrata: NEGATIVE
Candida Vaginitis: NEGATIVE
Chlamydia: NEGATIVE
Comment: NEGATIVE
Comment: NEGATIVE
Comment: NEGATIVE
Comment: NEGATIVE
Comment: NEGATIVE
Comment: NORMAL
Neisseria Gonorrhea: NEGATIVE
Trichomonas: NEGATIVE

## 2022-03-29 ENCOUNTER — Telehealth (HOSPITAL_COMMUNITY): Payer: Self-pay | Admitting: Emergency Medicine

## 2022-03-29 MED ORDER — METRONIDAZOLE 500 MG PO TABS: 500.0000 mg | ORAL_TABLET | Freq: Two times a day (BID) | ORAL | 0 refills | Status: DC

## 2022-06-19 ENCOUNTER — Ambulatory Visit
Admission: EM | Admit: 2022-06-19 | Discharge: 2022-06-19 | Disposition: A | Discharge status: Home / Self Care | Payer: Self-pay | Attending: Internal Medicine | Admitting: Internal Medicine

## 2022-06-19 DIAGNOSIS — Z3202 Encounter for pregnancy test, result negative: Secondary | ICD-10-CM | POA: Insufficient documentation

## 2022-06-19 DIAGNOSIS — Z113 Encounter for screening for infections with a predominantly sexual mode of transmission: Secondary | ICD-10-CM | POA: Insufficient documentation

## 2022-06-19 DIAGNOSIS — N926 Irregular menstruation, unspecified: Secondary | ICD-10-CM | POA: Insufficient documentation

## 2022-06-19 LAB — POCT URINE PREGNANCY: Preg Test, Ur: NEGATIVE

## 2022-06-19 NOTE — Discharge Instructions (Signed)
Your pregnancy test was negative.  STD testing is pending.  We will call if it is abnormal.  Please follow-up with gynecology for further evaluation and management and to have birth control replaced and/or changed.

## 2022-06-19 NOTE — ED Provider Notes (Signed)
[name]    CSN: [id number] Arrival date & time: 06/19/22  1329      History   Chief Complaint Chief Complaint  Patient presents with   STD Testing     HPI RUTHELLEN BEMISS is a 22 y.o. female.   Patient presents today for routine STD testing.  Patient denies any exposure to STD or any associated symptoms.  She has had unprotected intercourse with a single sexual partner.  Last menstrual cycle was in February but patient reports that she only spotted for 2 days and has not yet had a menstrual cycle in March.  Although, patient does report that irregular menstrual cycles are normal for her.  She does have Nexplanon birth control but reports that it has expired about 2 years ago.  She had it placed in 2019 after she delivered her son.  She did test positive for bacterial vaginosis in January but reports she only took 3 days of the medication.  She no longer has the symptoms as they resolved.  Patient's chart has a [place] that states that she has a legal guardian but appears that this was placed in 2017 when she was 22 years old.  Patient denies that she has a legal guardian and can make medical decisions for herself.     Past Medical History:  Diagnosis Date   Medical history non-contributory     Patient Active Problem List   Diagnosis Date Noted   Normal labor and delivery 08/11/2017   SVD (spontaneous vaginal delivery) 08/11/2017   Fetal cardiac anomaly affecting pregnancy, antepartum 08/01/2017   Supervision of normal first teen pregnancy 07/24/2017   Limited prenatal care in third trimester 07/24/2017   High risk teen pregnancy in third trimester 07/24/2017    Past Surgical History:  Procedure Laterality Date   NO PAST SURGERIES      OB History     Gravida  1   Para  1   Term  1   Preterm      AB      Living  1      SAB      IAB      Ectopic      Multiple  0   Live Births  1            Home Medications    Prior to  Admission medications   Medication Sig Start Date End Date Taking? Authorizing Provider  metroNIDAZOLE (FLAGYL) 500 MG tablet Take 1 tablet (500 mg total) by mouth 2 (two) times daily. 03/29/22   Lamptey, Myrene Galas, MD  doxycycline (VIBRAMYCIN) 100 MG capsule Take 1 capsule (100 mg total) by mouth 2 (two) times daily. Patient not taking: Reported on 03/27/2022 10/10/21   Sidney Ace    Family History Family History  Family history unknown: Yes    Social History Social History   Tobacco Use   Smoking status: Some Days    Types: Cigars   Smokeless tobacco: Never  Vaping Use   Vaping Use: Never used  Substance Use Topics   Alcohol use: Yes   Drug use: Yes    Types: Marijuana    Comment: every other day     Allergies   Patient has no known allergies.   Review of Systems Review of Systems Per HPI  Physical Exam Triage Vital Signs ED Triage Vitals  Enc Vitals Group     BP 06/19/22 1338 103/69     Pulse Rate  06/19/22 1338 74     Resp 06/19/22 1338 18     Temp 06/19/22 1338 98.5 F (36.9 C)     Temp Source 06/19/22 1338 Oral     SpO2 06/19/22 1338 98 %     Weight --      Height --      Head Circumference --      Peak Flow --      Pain Score 06/19/22 1337 0     Pain Loc --      Pain Edu? --      Excl. in [place]? --    No data found.  Updated Vital Signs BP 103/69 (BP Location: Left Arm)   Pulse 74   Temp 98.5 F (36.9 C) (Oral)   Resp 18   SpO2 98%   Visual Acuity Right Eye Distance:   Left Eye Distance:   Bilateral Distance:    Right Eye Near:   Left Eye Near:    Bilateral Near:     Physical Exam Constitutional:      General: She is not in acute distress.    Appearance: Normal appearance. She is not toxic-appearing or diaphoretic.  HENT:     Head: Normocephalic and atraumatic.  Eyes:     Extraocular Movements: Extraocular movements intact.     Conjunctiva/sclera: Conjunctivae normal.  Pulmonary:     Effort: Pulmonary effort is normal.   Genitourinary:    Comments: Deferred with shared decision making.  Self swab performed. Neurological:     General: No focal deficit present.     Mental Status: She is alert and oriented to person, place, and time. Mental status is at baseline.  Psychiatric:        Mood and Affect: Mood normal.        Behavior: Behavior normal.        Thought Content: Thought content normal.        Judgment: Judgment normal.      UC Treatments / Results  Labs (all labs ordered are listed, but only abnormal results are displayed) Labs Reviewed  POCT URINE PREGNANCY  CERVICOVAGINAL ANCILLARY ONLY    EKG   Radiology No results found.  Procedures Procedures (including critical care time)  Medications Ordered in UC Medications - No data to display  Initial Impression / Assessment and Plan / UC Course  I have reviewed the triage vital signs and the nursing notes.  Pertinent labs & imaging results that were available during my care of the patient were reviewed by me and considered in my medical decision making (see chart for details).     Patient here for routine STD testing.  Cervicovaginal swab is pending.  Urine pregnancy test completed per patient request given that she has not yet had her menstrual cycle.  It was negative.  Recommended quantitative hCG but patient declined this.  Patient also declined HIV and syphilis blood work.  Encouraged patient to follow-up with provided contact information for gynecology given missed menstrual cycle and to have Nexplanon replaced and/or changed given it has expired.  Patient verbalized understanding and was agreeable with plan. Final Clinical Impressions(s) / UC Diagnoses   Final diagnoses:  Screening examination for venereal disease  Urine pregnancy test negative  Missed menses     Discharge Instructions      Your pregnancy test was negative.  STD testing is pending.  We will call if it is abnormal.  Please follow-up with gynecology for  further evaluation and management and  to have birth control replaced and/or changed.     ED Prescriptions   None    PDMP not reviewed this encounter.   Teodora Medici, [place] 06/19/22 1401

## 2022-06-19 NOTE — ED Triage Notes (Signed)
Pt presents for STD testing with no known symptoms; pt states he last visit she was tested and treated for BV but did not complete medication.

## 2022-06-20 LAB — CERVICOVAGINAL ANCILLARY ONLY
Bacterial Vaginitis (gardnerella): POSITIVE — AB
Candida Glabrata: NEGATIVE
Candida Vaginitis: NEGATIVE
Chlamydia: NEGATIVE
Comment: NEGATIVE
Comment: NEGATIVE
Comment: NEGATIVE
Comment: NEGATIVE
Comment: NEGATIVE
Comment: NORMAL
Neisseria Gonorrhea: NEGATIVE
Trichomonas: NEGATIVE

## 2022-06-21 ENCOUNTER — Telehealth (HOSPITAL_COMMUNITY): Payer: Self-pay | Admitting: Emergency Medicine

## 2022-06-21 MED ORDER — METRONIDAZOLE 500 MG PO TABS: 500.0000 mg | ORAL_TABLET | Freq: Two times a day (BID) | ORAL | 0 refills | Status: DC

## 2022-08-07 ENCOUNTER — Encounter: Payer: Self-pay | Admitting: Obstetrics and Gynecology

## 2022-08-07 ENCOUNTER — Ambulatory Visit: Payer: Self-pay | Admitting: Obstetrics and Gynecology

## 2022-08-08 NOTE — Progress Notes (Signed)
Patient did not keep her GYN-nexplanon removal appointment for 08/07/2022.  Cornelia Copa MD Attending [facility] for [employer] [facility])

## 2022-09-07 ENCOUNTER — Ambulatory Visit
Admission: EM | Admit: 2022-09-07 | Discharge: 2022-09-07 | Disposition: A | Discharge status: Home / Self Care | Payer: Self-pay | Attending: Internal Medicine | Admitting: Internal Medicine

## 2022-09-07 DIAGNOSIS — Z3202 Encounter for pregnancy test, result negative: Secondary | ICD-10-CM

## 2022-09-07 DIAGNOSIS — Z113 Encounter for screening for infections with a predominantly sexual mode of transmission: Secondary | ICD-10-CM

## 2022-09-07 LAB — POCT URINE PREGNANCY: Preg Test, Ur: NEGATIVE

## 2022-09-07 NOTE — Discharge Instructions (Signed)
Pregnancy test was negative.  Vaginal swab is pending.  Will call if it is abnormal and treat.  Follow-up with gynecology for further evaluation and management.

## 2022-09-07 NOTE — ED Provider Notes (Signed)
[name]    CSN: [id number] Arrival date & time: 09/07/22  1650      History   Chief Complaint Chief Complaint  Patient presents with   SEXUALLY TRANSMITTED DISEASE    HPI Lisa Booth is a 22 y.o. female.   Patient presents today for STD testing.  She denies any exposure or any symptoms but reports that "she is concerned that something is not right" related to STDs.  She has had unprotected intercourse.  Last menstrual cycle was at the beginning of June.  She reports that it was just vaginal spotting.  Last normal menstrual cycle was present 2 weeks prior to vaginal spotting.  She states that menstrual cycles can be irregular given that she has Nexplanon in place.  Although, she reports that Nexplanon has expired for 2 years and she has been trying to get in with gynecology but lost contact information from last urgent care visit.  She is also concerned for persistent BV given that she only took 4 days of medication from previous visit.  She is denying any current symptoms.     Past Medical History:  Diagnosis Date   Medical history non-contributory     Patient Active Problem List   Diagnosis Date Noted   Normal labor and delivery 08/11/2017   SVD (spontaneous vaginal delivery) 08/11/2017   Fetal cardiac anomaly affecting pregnancy, antepartum 08/01/2017   Supervision of normal first teen pregnancy 07/24/2017   Limited prenatal care in third trimester 07/24/2017   High risk teen pregnancy in third trimester 07/24/2017    Past Surgical History:  Procedure Laterality Date   NO PAST SURGERIES      OB History     Gravida  1   Para  1   Term  1   Preterm      AB      Living  1      SAB      IAB      Ectopic      Multiple  0   Live Births  1            Home Medications    Prior to Admission medications   Medication Sig Start Date End Date Taking? Authorizing Provider  doxycycline (VIBRAMYCIN) 100 MG capsule Take 1 capsule  (100 mg total) by mouth 2 (two) times daily. Patient not taking: Reported on 03/27/2022 10/10/21   Elson Areas, PA-C  metroNIDAZOLE (FLAGYL) 500 MG tablet Take 1 tablet (500 mg total) by mouth 2 (two) times daily. 06/21/22   Lamptey, Britta Mccreedy, MD    Family History Family History  Family history unknown: Yes    Social History Social History   Tobacco Use   Smoking status: Some Days    Types: Cigars   Smokeless tobacco: Never  Vaping Use   Vaping Use: Never used  Substance Use Topics   Alcohol use: Yes   Drug use: Yes    Types: Marijuana    Comment: every other day     Allergies   Patient has no known allergies.   Review of Systems Review of Systems Per HPI  Physical Exam Triage Vital Signs ED Triage Vitals  Enc Vitals Group     BP 09/07/22 1700 95/66     Pulse Rate 09/07/22 1700 80     Resp 09/07/22 1700 16     Temp 09/07/22 1700 98.7 F (37.1 C)     Temp Source 09/07/22 1700 Oral  SpO2 09/07/22 1700 97 %     Weight --      Height --      Head Circumference --      Peak Flow --      Pain Score 09/07/22 1701 0     Pain Loc --      Pain Edu? --      Excl. in [place]? --    No data found.  Updated Vital Signs BP 95/66 (BP Location: Left Arm)   Pulse 80   Temp 98.7 F (37.1 C) (Oral)   Resp 16   LMP 08/20/2022 (Approximate)   SpO2 97%   Visual Acuity Right Eye Distance:   Left Eye Distance:   Bilateral Distance:    Right Eye Near:   Left Eye Near:    Bilateral Near:     Physical Exam Constitutional:      General: She is not in acute distress.    Appearance: Normal appearance. She is not toxic-appearing or diaphoretic.  HENT:     Head: Normocephalic and atraumatic.  Eyes:     Extraocular Movements: Extraocular movements intact.     Conjunctiva/sclera: Conjunctivae normal.  Pulmonary:     Effort: Pulmonary effort is normal.  Genitourinary:    Comments: Deferred with shared decision making.  Self swab performed. Neurological:     General:  No focal deficit present.     Mental Status: She is alert and oriented to person, place, and time. Mental status is at baseline.  Psychiatric:        Mood and Affect: Mood normal.        Behavior: Behavior normal.        Thought Content: Thought content normal.        Judgment: Judgment normal.      UC Treatments / Results  Labs (all labs ordered are listed, but only abnormal results are displayed) Labs Reviewed  POCT URINE PREGNANCY  CERVICOVAGINAL ANCILLARY ONLY    EKG   Radiology No results found.  Procedures Procedures (including critical care time)  Medications Ordered in UC Medications - No data to display  Initial Impression / Assessment and Plan / UC Course  I have reviewed the triage vital signs and the nursing notes.  Pertinent labs & imaging results that were available during my care of the patient were reviewed by me and considered in my medical decision making (see chart for details).     Cervicovaginal swab pending.  Given no confirmed exposure to STD and no current symptoms, will await result for further treatment.  She declined blood work for HIV and syphilis.  Urine pregnancy test was negative.  Patient advised to follow-up with provided at contact information for gynecology given Nexplanon has expired and for further evaluation and management.  Patient verbalized understanding and was agreeable with plan. Final Clinical Impressions(s) / UC Diagnoses   Final diagnoses:  Screening examination for venereal disease  Urine pregnancy test negative     Discharge Instructions      Pregnancy test was negative.  Vaginal swab is pending.  Will call if it is abnormal and treat.  Follow-up with gynecology for further evaluation and management.     ED Prescriptions   None    PDMP not reviewed this encounter.   Gustavus Bryant, [place] 09/07/22 1728

## 2022-09-07 NOTE — ED Triage Notes (Signed)
Pt states she would like to be tested for STD's denies any vaginal discharge but states she does have an odor.

## 2022-09-10 ENCOUNTER — Telehealth (HOSPITAL_COMMUNITY): Payer: Self-pay | Admitting: Emergency Medicine

## 2022-09-10 LAB — CERVICOVAGINAL ANCILLARY ONLY
Bacterial Vaginitis (gardnerella): POSITIVE — AB
Candida Glabrata: NEGATIVE
Candida Vaginitis: NEGATIVE
Chlamydia: NEGATIVE
Comment: NEGATIVE
Comment: NEGATIVE
Comment: NEGATIVE
Comment: NEGATIVE
Comment: NEGATIVE
Comment: NORMAL
Neisseria Gonorrhea: NEGATIVE
Trichomonas: NEGATIVE

## 2022-09-10 MED ORDER — METRONIDAZOLE 500 MG PO TABS: 500.0000 mg | ORAL_TABLET | Freq: Two times a day (BID) | ORAL | 0 refills | Status: DC

## 2022-09-30 ENCOUNTER — Ambulatory Visit
Admission: EM | Admit: 2022-09-30 | Discharge: 2022-09-30 | Disposition: A | Discharge status: Home / Self Care | Payer: Self-pay | Attending: Family Medicine | Admitting: Family Medicine

## 2022-09-30 DIAGNOSIS — Z202 Contact with and (suspected) exposure to infections with a predominantly sexual mode of transmission: Secondary | ICD-10-CM | POA: Insufficient documentation

## 2022-09-30 DIAGNOSIS — R21 Rash and other nonspecific skin eruption: Secondary | ICD-10-CM | POA: Insufficient documentation
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
## 2022-09-30 NOTE — ED Triage Notes (Signed)
"  I want STI check with Labs/Blood work". Currently no symptoms. Also concerned with bumps below lower lip "? Oral herpes". No exposure to STI known. No abnormal vaginal discharge. No dysuria.

## 2022-09-30 NOTE — Discharge Instructions (Signed)
Staff will notify you if there is anything positive on your vaginal swab, the swab of your chin, or on your blood work.

## 2022-10-01 LAB — CERVICOVAGINAL ANCILLARY ONLY
Bacterial Vaginitis (gardnerella): POSITIVE — AB
Candida Glabrata: NEGATIVE
Candida Vaginitis: NEGATIVE
Chlamydia: NEGATIVE
Comment: NEGATIVE
Comment: NEGATIVE
Comment: NEGATIVE
Comment: NEGATIVE
Comment: NEGATIVE
Comment: NORMAL
Neisseria Gonorrhea: NEGATIVE
Trichomonas: NEGATIVE

## 2022-10-02 ENCOUNTER — Telehealth: Payer: Self-pay | Admitting: Emergency Medicine

## 2022-10-02 MED ORDER — METRONIDAZOLE 500 MG PO TABS: 500.0000 mg | ORAL_TABLET | Freq: Two times a day (BID) | ORAL | 0 refills | Status: DC

## 2022-10-04 LAB — RPR, QUANT+TP ABS (REFLEX)
Rapid Plasma Reagin, Quant: 1:2 {titer} — ABNORMAL HIGH
T Pallidum Abs: NONREACTIVE

## 2022-10-04 LAB — RPR: RPR Ser Ql: REACTIVE — AB

## 2022-10-04 LAB — HIV ANTIBODY (ROUTINE TESTING W REFLEX): HIV Screen 4th Generation wRfx: NONREACTIVE

## 2022-10-04 LAB — HSV CULTURE AND TYPING

## 2022-12-05 ENCOUNTER — Encounter: Payer: Self-pay | Admitting: Family

## 2022-12-05 NOTE — Progress Notes (Signed)
Erroneous encounter-disregard

## 2022-12-25 IMAGING — DX DG RIBS W/ CHEST 3+V*R*
3 series · 3 of 3 positions shown · non-contrast
Comparison: None.

CLINICAL DATA: Right rib pain, no known injury

EXAM:
RIGHT RIBS AND CHEST - 3+ VIEW

[chest pa]
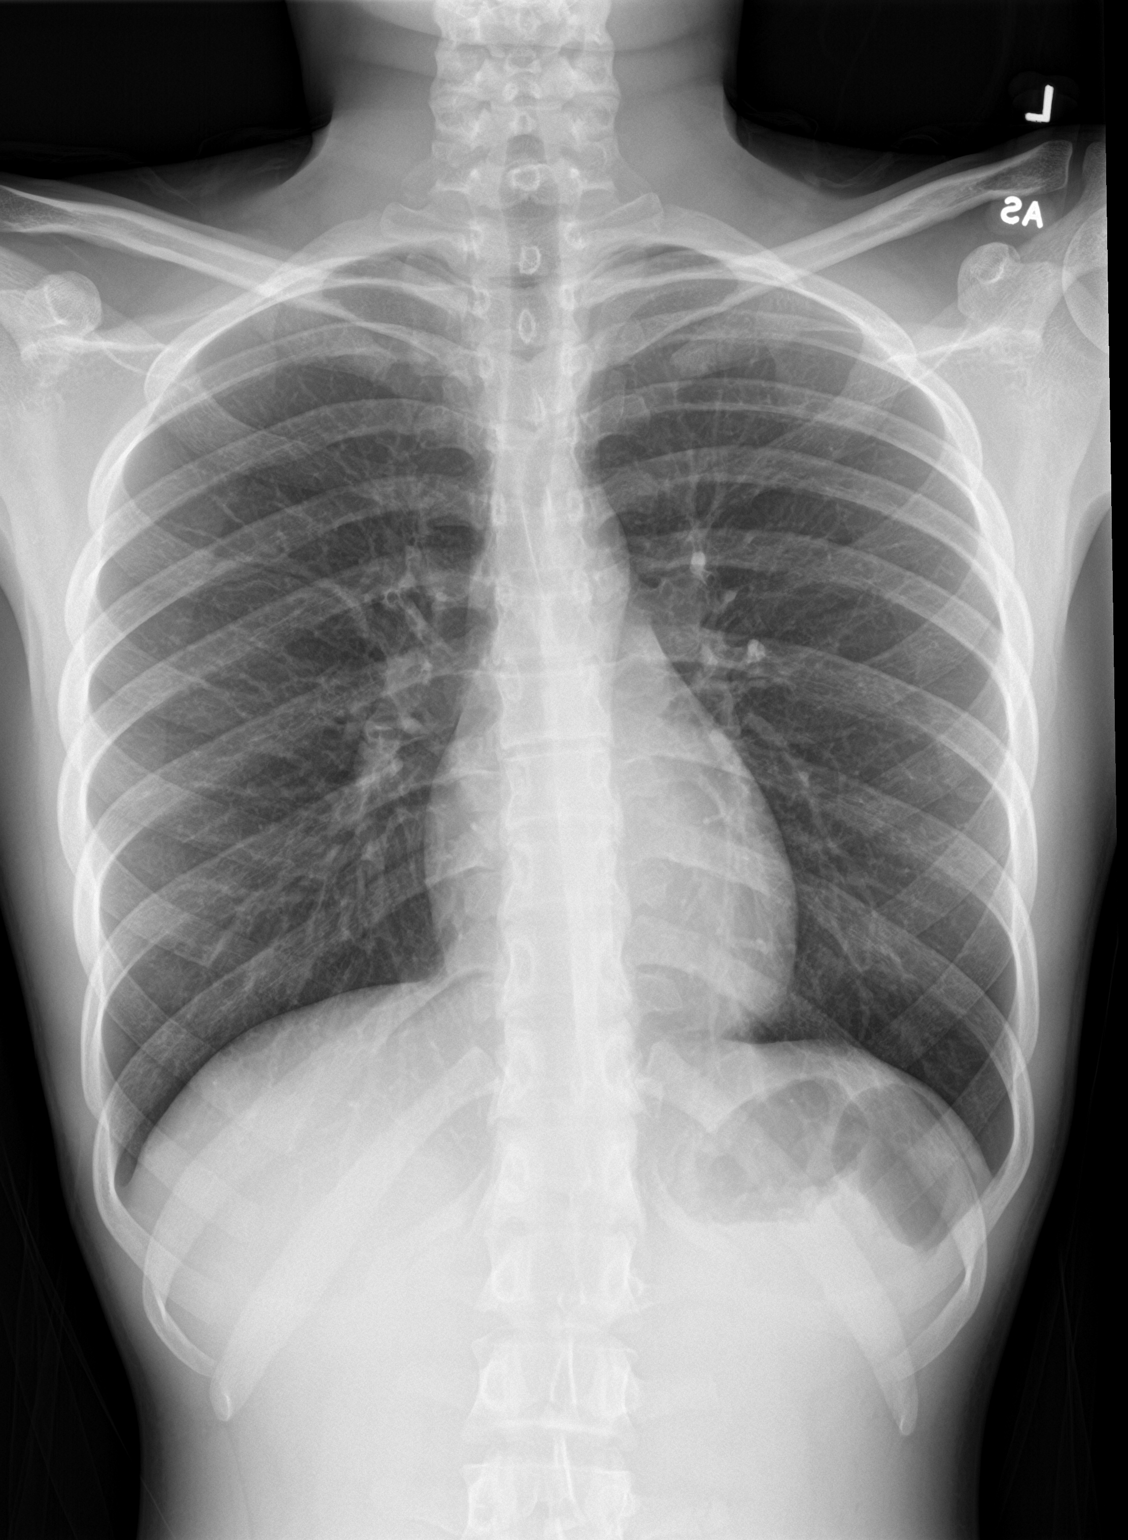

[rib pa]
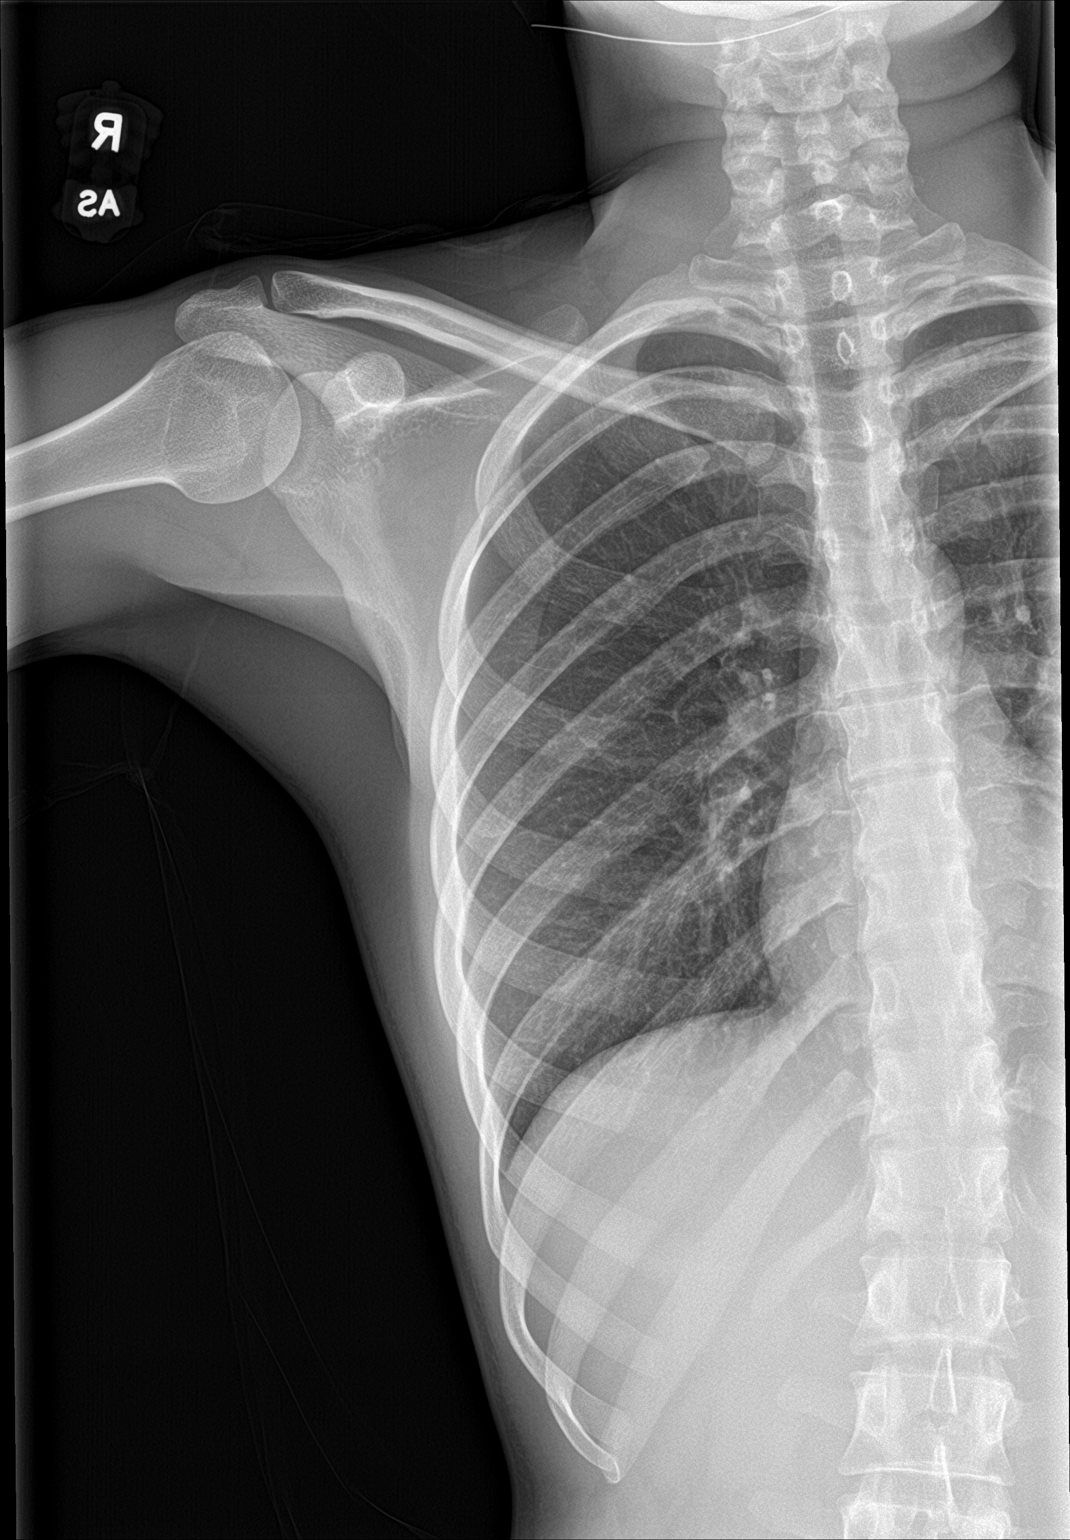

[rib obl]
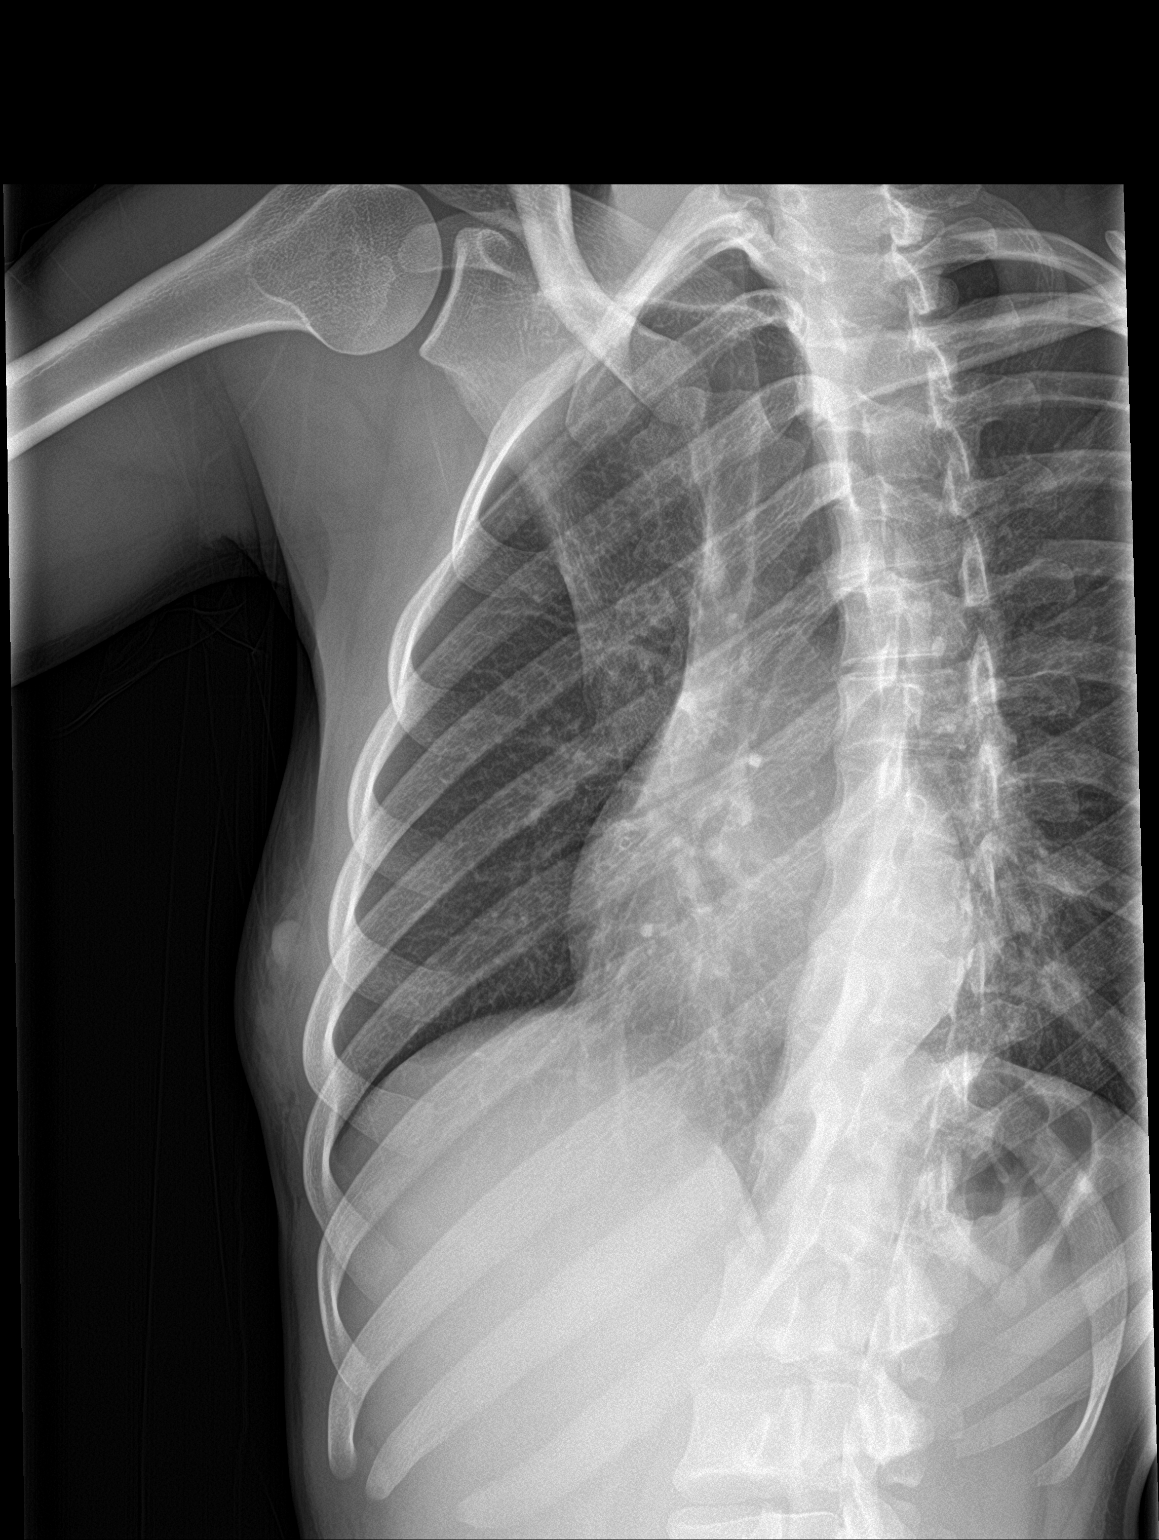

[3 of 3 positions shown; findings below may reference images not displayed]

FINDINGS: No fracture or other bone lesions are seen involving the ribs. There
is no evidence of pneumothorax or pleural effusion. Both lungs are
clear. Heart size and mediastinal contours are within normal limits.
IMPRESSION: No displaced rib fractures or other radiographic abnormality to
explain pain. No acute abnormality of the lungs.

## 2023-02-12 ENCOUNTER — Ambulatory Visit (INDEPENDENT_AMBULATORY_CARE_PROVIDER_SITE_OTHER): Payer: Self-pay | Admitting: Family

## 2023-02-12 ENCOUNTER — Encounter: Payer: Self-pay | Admitting: Family

## 2023-02-12 ENCOUNTER — Other Ambulatory Visit (HOSPITAL_COMMUNITY)
Admission: RE | Admit: 2023-02-12 | Discharge: 2023-02-12 | Disposition: A | Discharge status: Home / Self Care | Payer: Self-pay | Source: Ambulatory Visit | Attending: Family | Admitting: Family

## 2023-02-12 VITALS — BP 114/73 | HR 91 | Temp 98.4°F | Ht 64.0 in | Wt 118.2 lb

## 2023-02-12 DIAGNOSIS — B9689 Other specified bacterial agents as the cause of diseases classified elsewhere: Secondary | ICD-10-CM | POA: Insufficient documentation

## 2023-02-12 DIAGNOSIS — R21 Rash and other nonspecific skin eruption: Secondary | ICD-10-CM

## 2023-02-12 DIAGNOSIS — Z114 Encounter for screening for human immunodeficiency virus [HIV]: Secondary | ICD-10-CM

## 2023-02-12 DIAGNOSIS — Z113 Encounter for screening for infections with a predominantly sexual mode of transmission: Secondary | ICD-10-CM

## 2023-02-12 DIAGNOSIS — Z975 Presence of (intrauterine) contraceptive device: Secondary | ICD-10-CM

## 2023-02-12 DIAGNOSIS — Z3202 Encounter for pregnancy test, result negative: Secondary | ICD-10-CM

## 2023-02-12 DIAGNOSIS — Z124 Encounter for screening for malignant neoplasm of cervix: Secondary | ICD-10-CM

## 2023-02-12 DIAGNOSIS — N760 Acute vaginitis: Secondary | ICD-10-CM

## 2023-02-12 DIAGNOSIS — Z7689 Persons encountering health services in other specified circumstances: Secondary | ICD-10-CM

## 2023-02-12 LAB — POCT URINALYSIS DIP (CLINITEK)
Bilirubin, UA: NEGATIVE
Blood, UA: NEGATIVE
Glucose, UA: NEGATIVE mg/dL
Ketones, POC UA: NEGATIVE mg/dL
Nitrite, UA: NEGATIVE
POC PROTEIN,UA: NEGATIVE
Spec Grav, UA: 1.02 (ref 1.010–1.025)
Urobilinogen, UA: 0.2 U/dL
pH, UA: 6.5 (ref 5.0–8.0)

## 2023-02-12 LAB — POCT URINE PREGNANCY: Preg Test, Ur: NEGATIVE

## 2023-02-12 MED ORDER — TINIDAZOLE 500 MG PO TABS: 2.0000 g | ORAL_TABLET | Freq: Every day | ORAL | 0 refills | Status: AC

## 2023-02-12 MED ORDER — TRIAMCINOLONE ACETONIDE 0.025 % EX CREA: 1.0000 | TOPICAL_CREAM | Freq: Two times a day (BID) | CUTANEOUS | 0 refills | Status: AC

## 2023-02-12 NOTE — Progress Notes (Signed)
Subjective:    Lisa Booth - 22 y.o. female MRN [id number]  Date of birth: 02-09-2001  HPI  Lisa Booth is to establish care.  Current issues and/or concerns: - Routine STD testing. She denies recent exposure.  - Reports frequent history of bacterial vaginitis. States she thinks she currently has the same. Reports she does not prefer Metronidazole. - States Nexplanon was supposed to be removed 2 years ago. - Bilateral feet rash x 3 years. Denies red flag symptoms.  - No further issues/concerns for discussion today.    ROS per HPI    Health Maintenance:  Health Maintenance Due  Topic Date Due   HPV VACCINES (1 - 3-dose series) Never done   Hepatitis C Screening  Never done   Cervical Cancer Screening (Pap smear)  Never done   INFLUENZA VACCINE  Never done   COVID-19 Vaccine (1 - 2023-24 season) Never done     Past Medical History: Patient Active Problem List   Diagnosis Date Noted   Normal labor and delivery 08/11/2017   SVD (spontaneous vaginal delivery) 08/11/2017   Fetal cardiac anomaly affecting pregnancy, antepartum 08/01/2017   Supervision of normal first teen pregnancy 07/24/2017   Limited prenatal care in third trimester 07/24/2017   High risk teen pregnancy in third trimester 07/24/2017      Social History   reports that she has been smoking cigars. She has never used smokeless tobacco. She reports current alcohol use. She reports current drug use. Drug: Marijuana.   Family History  Family history is unknown by patient.   Medications: reviewed and updated   Objective:   Physical Exam BP 114/73   Pulse 91   Temp 98.4 F (36.9 C) (Oral)   Ht 5\' 4"  (1.626 m)   Wt 118 lb 3.2 oz (53.6 kg)   LMP 01/14/2023 (Exact Date)   SpO2 97%   BMI 20.29 kg/m   Physical Exam HENT:     Head: Normocephalic and atraumatic.     Nose: Nose normal.     Mouth/Throat:     Mouth: Mucous membranes are moist.     Pharynx: Oropharynx is clear.  Eyes:      Extraocular Movements: Extraocular movements intact.     Conjunctiva/sclera: Conjunctivae normal.     Pupils: Pupils are equal, round, and reactive to light.  Cardiovascular:     Rate and Rhythm: Normal rate and regular rhythm.     Pulses: Normal pulses.     Heart sounds: Normal heart sounds.  Pulmonary:     Effort: Pulmonary effort is normal.     Breath sounds: Normal breath sounds.  Musculoskeletal:        General: Normal range of motion.     Cervical back: Normal range of motion and neck supple.  Neurological:     General: No focal deficit present.     Mental Status: She is alert and oriented to person, place, and time.  Psychiatric:        Mood and Affect: Mood normal.        Behavior: Behavior normal.        Assessment & Plan:  1. Encounter to establish care - Patient presents today to establish care. During the interim follow-up with primary provider as scheduled.  - Return for annual physical examination, labs, and health maintenance. Arrive fasting meaning having no food for at least 8 hours prior to appointment. You may have only water or black coffee. Please take scheduled medications as normal.  2. Routine screening for STI (sexually transmitted infection) - Routine screening.  - Cervicovaginal ancillary only - POCT URINALYSIS DIP (CLINITEK); Future  3. Encounter for screening for HIV - Routine screening.  - HIV antibody (with reflex)  4. BV (bacterial vaginosis) - Empiric treatment with Tinidazole as prescribed. Counseled on medication adherence/adverse effects.  - Routine screening.  - Follow-up with primary provider as scheduled. - Cervicovaginal ancillary only - tinidazole (TINDAMAX) 500 MG tablet; Take 4 tablets (2,000 mg total) by mouth daily with breakfast for 2 days.  Dispense: 8 tablet; Refill: 0  5. Pap smear for cervical cancer screening - Referral to Gynecology for evaluation/management. - Ambulatory referral to Gynecology  6. Nexplanon in  place - Routine screening.  - Referral to Gynecology for evaluation/management. - POCT urine pregnancy; Future - Ambulatory referral to Gynecology  7. Rash of both feet - Triamcinolone cream as prescribed. Counseled on medication adherence/adverse effects.  - Referral to Dermatology for evaluation/management.  - Follow-up with primary provider as scheduled until established with referral. - Ambulatory referral to Dermatology - triamcinolone (KENALOG) 0.025 % cream; Apply 1 Application topically 2 (two) times daily.  Dispense: 30 g; Refill: 0     Patient was given clear instructions to go to Emergency Department or return to medical center if symptoms don't improve, worsen, or new problems develop.The patient verbalized understanding.  I discussed the assessment and treatment plan with the patient. The patient was provided an opportunity to ask questions and all were answered. The patient agreed with the plan and demonstrated an understanding of the instructions.   The patient was advised to call back or seek an in-person evaluation if the symptoms worsen or if the condition fails to improve as anticipated.    Ricky Stabs, NP 02/12/2023, 2:23 PM Primary Care at [facility]

## 2023-02-12 NOTE — Progress Notes (Signed)
Patient states multiple complaints to discuss.  Declined Flu vaccine.

## 2023-02-13 ENCOUNTER — Other Ambulatory Visit: Payer: Self-pay | Admitting: Family

## 2023-02-13 DIAGNOSIS — A749 Chlamydial infection, unspecified: Secondary | ICD-10-CM

## 2023-02-13 DIAGNOSIS — B3731 Acute candidiasis of vulva and vagina: Secondary | ICD-10-CM

## 2023-02-13 DIAGNOSIS — A5901 Trichomonal vulvovaginitis: Secondary | ICD-10-CM

## 2023-02-13 DIAGNOSIS — N760 Acute vaginitis: Secondary | ICD-10-CM

## 2023-02-13 LAB — CERVICOVAGINAL ANCILLARY ONLY
Bacterial Vaginitis (gardnerella): POSITIVE — AB
Candida Glabrata: NEGATIVE
Candida Vaginitis: POSITIVE — AB
Chlamydia: POSITIVE — AB
Comment: NEGATIVE
Comment: NEGATIVE
Comment: NEGATIVE
Comment: NEGATIVE
Comment: NEGATIVE
Comment: NORMAL
Neisseria Gonorrhea: NEGATIVE
Trichomonas: POSITIVE — AB

## 2023-02-13 MED ORDER — FLUCONAZOLE 150 MG PO TABS: 150.0000 mg | ORAL_TABLET | ORAL | 0 refills | Status: AC

## 2023-02-13 MED ORDER — DOXYCYCLINE HYCLATE 100 MG PO TABS: 100.0000 mg | ORAL_TABLET | Freq: Two times a day (BID) | ORAL | 0 refills | Status: AC

## 2023-05-06 ENCOUNTER — Ambulatory Visit
Admission: EM | Admit: 2023-05-06 | Discharge: 2023-05-06 | Disposition: A | Discharge status: Home / Self Care | Payer: Self-pay | Attending: Physician Assistant | Admitting: Physician Assistant

## 2023-05-06 DIAGNOSIS — R350 Frequency of micturition: Secondary | ICD-10-CM

## 2023-05-06 DIAGNOSIS — Z113 Encounter for screening for infections with a predominantly sexual mode of transmission: Secondary | ICD-10-CM

## 2023-05-06 LAB — POCT URINALYSIS DIP (MANUAL ENTRY)
Bilirubin, UA: NEGATIVE
Glucose, UA: NEGATIVE mg/dL
Ketones, POC UA: NEGATIVE mg/dL
Leukocytes, UA: NEGATIVE
Nitrite, UA: NEGATIVE
Protein Ur, POC: NEGATIVE mg/dL
Spec Grav, UA: >=1.03 — AB (ref 1.010–1.025)
Urobilinogen, UA: 1 U/dL
pH, UA: 6 (ref 5.0–8.0)

## 2023-05-06 LAB — POCT URINE PREGNANCY: Preg Test, Ur: NEGATIVE

## 2023-05-06 NOTE — ED Triage Notes (Signed)
"  I need STI testing". "I am having UTI symptoms (frequency with urination) and spotting blood". No abd pain. No nausea or vomiting.

## 2023-05-07 LAB — URINE CULTURE: Culture: <10000 — AB

## 2023-05-08 LAB — CERVICOVAGINAL ANCILLARY ONLY
Bacterial Vaginitis (gardnerella): POSITIVE — AB
Candida Glabrata: NEGATIVE
Candida Vaginitis: POSITIVE — AB
Chlamydia: NEGATIVE
Comment: NEGATIVE
Comment: NEGATIVE
Comment: NEGATIVE
Comment: NEGATIVE
Comment: NEGATIVE
Comment: NORMAL
Neisseria Gonorrhea: NEGATIVE
Trichomonas: NEGATIVE

## 2023-05-09 ENCOUNTER — Telehealth: Payer: Self-pay

## 2023-05-09 MED ORDER — FLUCONAZOLE 150 MG PO TABS: 150.0000 mg | ORAL_TABLET | Freq: Once | ORAL | 0 refills | Status: AC

## 2023-05-09 MED ORDER — METRONIDAZOLE 500 MG PO TABS: 500.0000 mg | ORAL_TABLET | Freq: Two times a day (BID) | ORAL | 0 refills | Status: AC

## 2023-05-09 NOTE — Telephone Encounter (Signed)
Per protocol, pt requires tx with metronidazole and Diflucan.  Rx sent to pharmacy on file.

## 2023-05-10 NOTE — ED Provider Notes (Signed)
[name]    CSN: [id number] Arrival date & time: 05/06/23  1718      History   Chief Complaint Chief Complaint  Patient presents with   SEXUALLY TRANSMITTED DISEASE    Testing   Possible Pregnancy    HPI Lisa Booth is a 23 y.o. female.   Patient here today for STD screening.  She reports that she is having some UTI symptoms as well.  She notes that she has had frequency with urination as spot of blood.  She denies any abdominal pain, nausea or vomiting.  She does not report dysuria.  She denies any known STD exposure.  The history is provided by the patient.  Possible Pregnancy Pertinent negatives include no abdominal pain.    Past Medical History:  Diagnosis Date   Medical history non-contributory     Patient Active Problem List   Diagnosis Date Noted   Normal labor and delivery 08/11/2017   SVD (spontaneous vaginal delivery) 08/11/2017   Fetal cardiac anomaly affecting pregnancy, antepartum 08/01/2017   Supervision of normal first teen pregnancy 07/24/2017   Limited prenatal care in third trimester 07/24/2017   High risk teen pregnancy in third trimester 07/24/2017    Past Surgical History:  Procedure Laterality Date   NO PAST SURGERIES      OB History     Gravida  1   Para  1   Term  1   Preterm      AB      Living  1      SAB      IAB      Ectopic      Multiple  0   Live Births  1            Home Medications    Prior to Admission medications   Medication Sig Start Date End Date Taking? Authorizing Provider  etonogestrel (NEXPLANON) 68 MG IMPL implant 1 each by Subdermal route once.   Yes [provider]  metroNIDAZOLE (FLAGYL) 500 MG tablet Take 1 tablet (500 mg total) by mouth 2 (two) times daily for 7 days. 05/09/23 05/16/23  Zenia Resides, MD  triamcinolone (KENALOG) 0.025 % cream Apply 1 Application topically 2 (two) times daily. 02/12/23   Rema Fendt, NP    Family History Family  History  Family history unknown: Yes    Social History Social History   Tobacco Use   Smoking status: Some Days    Types: Cigars   Smokeless tobacco: Never  Vaping Use   Vaping status: Some Days   Substances: Nicotine, Flavoring  Substance Use Topics   Alcohol use: Yes    Comment: Socially.   Drug use: Yes    Types: Marijuana    Comment: every other day     Allergies   Patient has no known allergies.   Review of Systems Review of Systems  Constitutional:  Negative for chills and fever.  Eyes:  Negative for discharge and redness.  Gastrointestinal:  Negative for abdominal pain, nausea and vomiting.  Genitourinary:  Positive for frequency. Negative for dysuria and vaginal discharge.     Physical Exam Triage Vital Signs ED Triage Vitals  Encounter Vitals Group     BP 05/06/23 1859 100/63     Systolic BP Percentile --      Diastolic BP Percentile --      Pulse Rate 05/06/23 1859 88     Resp 05/06/23 1859 16  Temp 05/06/23 1859 98.1 F (36.7 C)     Temp Source 05/06/23 1859 Oral     SpO2 05/06/23 1859 97 %     Weight 05/06/23 1857 120 lb (54.4 kg)     Height 05/06/23 1857 5\' 4"  (1.626 m)     Head Circumference --      Peak Flow --      Pain Score 05/06/23 1851 0     Pain Loc --      Pain Education --      Exclude from Growth Chart --    No data found.  Updated Vital Signs BP 100/63 (BP Location: Right Arm)   Pulse 88   Temp 98.1 F (36.7 C) (Oral)   Resp 16   Ht 5\' 4"  (1.626 m)   Wt 120 lb (54.4 kg)   LMP 03/10/2023 (Approximate)   SpO2 97%   BMI 20.60 kg/m   Visual Acuity Right Eye Distance:   Left Eye Distance:   Bilateral Distance:    Right Eye Near:   Left Eye Near:    Bilateral Near:     Physical Exam Vitals and nursing note reviewed.  Constitutional:      General: She is not in acute distress.    Appearance: Normal appearance. She is not ill-appearing.  HENT:     Head: Normocephalic and atraumatic.  Eyes:      Conjunctiva/sclera: Conjunctivae normal.  Cardiovascular:     Rate and Rhythm: Normal rate.  Pulmonary:     Effort: Pulmonary effort is normal.  Neurological:     Mental Status: She is alert.  Psychiatric:        Mood and Affect: Mood normal.        Behavior: Behavior normal.        Thought Content: Thought content normal.      UC Treatments / Results  Labs (all labs ordered are listed, but only abnormal results are displayed) Labs Reviewed  URINE CULTURE - Abnormal; Notable for the following components:      Result Value   Culture   (*)    Value: <10,000 COLONIES/mL INSIGNIFICANT GROWTH Performed at [facility], [address], [place], [place] [address]    All other components within normal limits  POCT URINALYSIS DIP (MANUAL ENTRY) - Abnormal; Notable for the following components:   Clarity, UA cloudy (*)    Spec Grav, UA >=1.030 (*)    Blood, UA small (*)    All other components within normal limits  CERVICOVAGINAL ANCILLARY ONLY - Abnormal; Notable for the following components:   Bacterial Vaginitis (gardnerella) Positive (*)    Candida Vaginitis Positive (*)    All other components within normal limits  POCT URINE PREGNANCY - Normal    EKG   Radiology No results found.  Procedures Procedures (including critical care time)  Medications Ordered in UC Medications - No data to display  Initial Impression / Assessment and Plan / UC Course  I have reviewed the triage vital signs and the nursing notes.  Pertinent labs & imaging results that were available during my care of the patient were reviewed by me and considered in my medical decision making (see chart for details).    UA without sign of UTI. Will order culture and STD screening as requested as well as screening for BV and yeast. Encouraged follow up with any concerns otherwise will await results for further recommendation.   Final Clinical Impressions(s) / UC Diagnoses  Final diagnoses:   Screening for STD (sexually transmitted disease)  Urinary frequency   Discharge Instructions   None    ED Prescriptions   None    PDMP not reviewed this encounter.   Tomi Bamberger, PA-C 05/10/23 [phone]

## 2023-05-13 NOTE — Progress Notes (Unsigned)
NEW GYNECOLOGY PATIENT Patient name: Lisa Booth MRN [id number]  Date of birth: [birth year] Chief Complaint:   No chief complaint on file.     History:  Lisa Booth is a 23 y.o. G1P1001 being seen today for pap and nexplanon removal. Does not want a new nexplanon in and would like to switch to condoms.  Reports las pap was about 5 years ago.   Per PCP note: nexplanon due for removal 2 years ago (inserted 2019)     Gynecologic History Patient's last menstrual period was 03/10/2023 (approximate). Contraception: Nexplanon > condoms Last Pap: No results found for: "DIAGPAP", "HPVHIGH", "ADEQPAP" Reports pap about 5 yeaers Last Mammogram: n/a Last Colonoscopy: n/a  Obstetric History OB History  Gravida Para Term Preterm AB Living  1 1 1   1   SAB IAB Ectopic Multiple Live Births     0 1    # Outcome Date GA Lbr Len/2nd Weight Sex Type Anes PTL Lv  1 Term 08/11/17 [redacted]w[redacted]d 28:46 / 00:28 5 lb 14.9 oz (2.69 kg) M Vag-Spont EPI  LIV    Past Medical History:  Diagnosis Date   Medical history non-contributory     Past Surgical History:  Procedure Laterality Date   NO PAST SURGERIES      Current Outpatient Medications on File Prior to Visit  Medication Sig Dispense Refill   etonogestrel (NEXPLANON) 68 MG IMPL implant 1 each by Subdermal route once.     metroNIDAZOLE (FLAGYL) 500 MG tablet Take 1 tablet (500 mg total) by mouth 2 (two) times daily for 7 days. 14 tablet 0   triamcinolone (KENALOG) 0.025 % cream Apply 1 Application topically 2 (two) times daily. 30 g 0   No current facility-administered medications on file prior to visit.    No Known Allergies  Social History:  reports that she has been smoking cigars. She has never used smokeless tobacco. She reports current alcohol use. She reports current drug use. Drug: Marijuana.  Family History  Family history unknown: Yes    The following portions of the patient's history were reviewed and updated as appropriate:  allergies, current medications, past family history, past medical history, past social history, past surgical history and problem list.  Review of Systems Pertinent items noted in HPI and remainder of comprehensive ROS otherwise negative.  Physical Exam:  BP 106/64   Pulse 85   Wt 121 lb (54.9 kg)   LMP 05/10/2023 (Approximate)   BMI 20.77 kg/m  Physical Exam Vitals and nursing note reviewed. Exam conducted with a chaperone present.  Constitutional:      Appearance: Normal appearance.  Cardiovascular:     Rate and Rhythm: Normal rate.  Pulmonary:     Effort: Pulmonary effort is normal.     Breath sounds: Normal breath sounds.  Genitourinary:    General: Normal vulva.     Exam position: Lithotomy position.     Vagina: Normal.     Cervix: Normal.     Comments: Small volume dark blood  Neurological:     General: No focal deficit present.     Mental Status: She is alert and oriented to person, place, and time.  Psychiatric:        Mood and Affect: Mood normal.        Behavior: Behavior normal.        Thought Content: Thought content normal.        Judgment: Judgment normal.    NEXPLANON REMOVAL The risks (including infection,  bleeding, pain, and uterine perforation) and benefits of the procedure were explained to the patient and Written informed consent was obtained.   Device was palpated in left upper arm. After time out, the skin was cleaned with alcohol and infiltrated with 2cc of 1% lidocaine with epinephrine was used to infiltrate the skin and subcutaneous tissue deep to the device. The area was cleaned with betadine x3.  Using an 11 blade, the skin was incised and the implant was removed intact with hemostat forceps. The implant was shown to the patient.  The skin was cleaned, incision covered with Steri-Strips, and an adhesive bandage.  Arm was wrapped and post procedure instructions provided to the patient.  Condoms chose as new contraceptive method.     Assessment  and Plan:   1. Screening for cervical cancer (Primary) Routine pap collected  - Cytology - PAP( [facility])  2. Nexplanon removal Now s/p uncomplicated nexplanon removal. Patient elects to use condoms for contraception going forward.    Routine preventative health maintenance measures emphasized. Please refer to After Visit Summary for other counseling recommendations.   Follow-up: No follow-ups on file.      Lorriane Shire, MD Obstetrician & Gynecologist, Faculty Practice [facility] [employer], [facility]

## 2023-05-14 ENCOUNTER — Other Ambulatory Visit (HOSPITAL_COMMUNITY)
Admission: RE | Admit: 2023-05-14 | Discharge: 2023-05-14 | Disposition: A | Discharge status: Home / Self Care | Payer: Self-pay | Source: Ambulatory Visit | Attending: Obstetrics and Gynecology | Admitting: Obstetrics and Gynecology

## 2023-05-14 ENCOUNTER — Encounter: Payer: Self-pay | Admitting: Obstetrics and Gynecology

## 2023-05-14 ENCOUNTER — Other Ambulatory Visit: Payer: Self-pay

## 2023-05-14 ENCOUNTER — Ambulatory Visit (INDEPENDENT_AMBULATORY_CARE_PROVIDER_SITE_OTHER): Payer: Self-pay | Admitting: Obstetrics and Gynecology

## 2023-05-14 VITALS — BP 106/64 | HR 85 | Wt 121.0 lb

## 2023-05-14 DIAGNOSIS — Z3046 Encounter for surveillance of implantable subdermal contraceptive: Secondary | ICD-10-CM

## 2023-05-14 DIAGNOSIS — Z124 Encounter for screening for malignant neoplasm of cervix: Secondary | ICD-10-CM

## 2023-05-14 NOTE — Patient Instructions (Signed)
Your NEXPLANON implant was just removed Here is some helpful information on what you can expect and how to care for your removal site. 24 Hours wear your top bandage The compression bandage helps minimize bruising.  3-5 Days keep your implant site covered While the insertion site is healing, keep the area covered with a smaller bandage.

## 2023-05-16 ENCOUNTER — Encounter: Payer: Self-pay | Admitting: General Practice

## 2023-05-20 ENCOUNTER — Encounter: Payer: Self-pay | Admitting: Obstetrics and Gynecology

## 2023-05-20 LAB — CYTOLOGY - PAP
Diagnosis: NEGATIVE
Diagnosis: REACTIVE

## 2023-05-22 ENCOUNTER — Telehealth: Payer: Self-pay | Admitting: Family

## 2023-05-22 ENCOUNTER — Encounter: Payer: Self-pay | Admitting: Family

## 2023-05-22 NOTE — Progress Notes (Signed)
 Erroneous encounter-disregard

## 2023-05-22 NOTE — Telephone Encounter (Signed)
 Called pt to reschedule missed physical appt; could not reach or leave vm due to full vm box

## 2023-05-26 ENCOUNTER — Other Ambulatory Visit: Payer: Self-pay

## 2023-05-26 ENCOUNTER — Encounter (HOSPITAL_COMMUNITY): Payer: Self-pay

## 2023-05-26 ENCOUNTER — Emergency Department (HOSPITAL_COMMUNITY)
Admission: EM | Admit: 2023-05-26 | Discharge: 2023-05-26 | Disposition: A | Discharge status: Home / Self Care | Payer: Self-pay | Attending: Emergency Medicine | Admitting: Emergency Medicine

## 2023-05-26 DIAGNOSIS — H00011 Hordeolum externum right upper eyelid: Secondary | ICD-10-CM | POA: Insufficient documentation

## 2023-05-26 DIAGNOSIS — L03213 Periorbital cellulitis: Secondary | ICD-10-CM | POA: Insufficient documentation

## 2023-05-26 LAB — POC URINE PREG, ED: Preg Test, Ur: NEGATIVE

## 2023-05-26 MED ORDER — AMOXICILLIN-POT CLAVULANATE 875-125 MG PO TABS: 1.0000 | ORAL_TABLET | Freq: Two times a day (BID) | ORAL | 0 refills | Status: AC

## 2023-05-26 MED ORDER — AMOXICILLIN-POT CLAVULANATE 875-125 MG PO TABS
1.0000 | ORAL_TABLET | Freq: Once | ORAL | Status: AC
  Administered 2023-05-26: 1 via ORAL
  Filled 2023-05-26: qty 1

## 2023-05-26 MED ORDER — DOXYCYCLINE HYCLATE 100 MG PO CAPS: 100.0000 mg | ORAL_CAPSULE | Freq: Two times a day (BID) | ORAL | 0 refills | Status: AC

## 2023-05-26 MED ORDER — DOXYCYCLINE HYCLATE 100 MG PO TABS
100.0000 mg | ORAL_TABLET | Freq: Once | ORAL | Status: AC
  Administered 2023-05-26: 100 mg via ORAL
  Filled 2023-05-26: qty 1

## 2023-05-26 NOTE — ED Triage Notes (Signed)
 Pt took lashes off with dirty hands a week ago and now has an infected stye on her top right eyelid, states she woke up with puss draining from it.

## 2023-05-26 NOTE — ED Provider Notes (Signed)
 [place] EMERGENCY DEPARTMENT AT [facility] Provider Note   CSN: [id number] Arrival date & time: 05/26/23  1813     History  Chief Complaint  Patient presents with   Eye Problem    Lisa Booth is a 23 y.o. female, no pertinent past medical history, who presents to the ED secondary to right eye swelling is been going on for last 2 days.  She states that about a week ago, she took her lashes off, with some dirty hands, and she has a stye.  The redness and swelling of her eye is gone on for the last 2 days.  She denies any vision changes, no pain with extraocular movements, no fevers or chills.  Home Medications Prior to Admission medications   Medication Sig Start Date End Date Taking? Authorizing Provider  amoxicillin-clavulanate (AUGMENTIN) 875-125 MG tablet Take 1 tablet by mouth every 12 (twelve) hours. 05/26/23  Yes Herrick Hartog L, PA  doxycycline (VIBRAMYCIN) 100 MG capsule Take 1 capsule (100 mg total) by mouth 2 (two) times daily. 05/26/23  Yes Chung Chagoya L, PA  etonogestrel (NEXPLANON) 68 MG IMPL implant 1 each by Subdermal route once. Patient not taking: Reported on 05/14/2023    [provider]  triamcinolone (KENALOG) 0.025 % cream Apply 1 Application topically 2 (two) times daily. Patient not taking: Reported on 05/14/2023 02/12/23   Rema Fendt, NP      Allergies    Patient has no known allergies.    Review of Systems   Review of Systems  Constitutional:  Negative for fever.  Skin:  Positive for rash.    Physical Exam Updated Vital Signs BP 114/70 (BP Location: Right Arm)   Pulse 75   Temp 98.3 F (36.8 C)   Resp 16   LMP 05/10/2023 (Approximate)   SpO2 100%  Physical Exam Vitals and nursing note reviewed.  Constitutional:      General: She is not in acute distress.    Appearance: She is well-developed.  HENT:     Head: Normocephalic and atraumatic.  Eyes:     General:        Right eye: Discharge present.        Left eye:  No discharge.     Conjunctiva/sclera: Conjunctivae normal.     Comments: Pustule, to left upper lid, with overlying erythema of upper lid, and edema.  Extraocular movements intact.  No visual deficits  Pulmonary:     Effort: No respiratory distress.  Neurological:     Mental Status: She is alert.     Comments: Clear speech.   Psychiatric:        Behavior: Behavior normal.        Thought Content: Thought content normal.     ED Results / Procedures / Treatments   Labs (all labs ordered are listed, but only abnormal results are displayed) Labs Reviewed  POC URINE PREG, ED    EKG None  Radiology No results found.  Procedures Procedures    Medications Ordered in ED Medications  amoxicillin-clavulanate (AUGMENTIN) 875-125 MG per tablet 1 tablet (has no administration in time range)  doxycycline (VIBRA-TABS) tablet 100 mg (has no administration in time range)    ED Course/ Medical Decision Making/ A&P                                 Medical Decision Making Patient has edematous right upper eyelid,  had lashes placed and removed last week.  Area has a purulent pustule, on the lid, and is edematous.  She has no extraocular movement pain, or changes in vision, she has a severe edema of the eyelid, spoke with the pharmacist, we have elected to add MRSA coverage, secondary to her procedure with her eyelashes.  We will do doxycycline and Augmentin for coverage, and have her follow-up with ophthalmology.  Risk Prescription drug management.    Final Clinical Impression(s) / ED Diagnoses Final diagnoses:  Preseptal cellulitis of right upper eyelid  Hordeolum of right upper eyelid, unspecified hordeolum type    Rx / DC Orders ED Discharge Orders          Ordered    amoxicillin-clavulanate (AUGMENTIN) 875-125 MG tablet  Every 12 hours        05/26/23 1853    doxycycline (VIBRAMYCIN) 100 MG capsule  2 times daily        05/26/23 [address] [place],  [place] 05/26/23 1920    Gwyneth Sprout, MD 05/26/23 2019

## 2023-05-26 NOTE — Discharge Instructions (Addendum)
 You have an infection of your eyelid.  Please take the antibiotic as prescribed, return to the ER if you have any vision changes, pain with movement of your eye, fevers, or chills or if you feel like the area is spreading.  Please follow-up with ophthalmology as needed.  Follow-up with your PCP.  Take the antibiotic as prescribed, do not stop taking it if you are feeling better complete the whole course

## 2023-10-01 ENCOUNTER — Encounter: Payer: Self-pay | Admitting: Emergency Medicine

## 2023-10-01 ENCOUNTER — Ambulatory Visit
Admission: EM | Admit: 2023-10-01 | Discharge: 2023-10-01 | Disposition: A | Discharge status: Home / Self Care | Payer: Self-pay | Attending: Emergency Medicine | Admitting: Emergency Medicine

## 2023-10-01 DIAGNOSIS — L292 Pruritus vulvae: Secondary | ICD-10-CM | POA: Insufficient documentation

## 2023-10-01 DIAGNOSIS — F17290 Nicotine dependence, other tobacco product, uncomplicated: Secondary | ICD-10-CM | POA: Insufficient documentation

## 2023-10-01 DIAGNOSIS — N898 Other specified noninflammatory disorders of vagina: Secondary | ICD-10-CM | POA: Insufficient documentation

## 2023-10-01 LAB — POCT URINE PREGNANCY: Preg Test, Ur: NEGATIVE

## 2023-10-01 MED ORDER — METRONIDAZOLE 500 MG PO TABS: 500.0000 mg | ORAL_TABLET | Freq: Two times a day (BID) | ORAL | 0 refills | Status: AC

## 2023-10-01 MED ORDER — FLUCONAZOLE 150 MG PO TABS: 150.0000 mg | ORAL_TABLET | ORAL | 0 refills | Status: AC

## 2023-10-01 NOTE — ED Provider Notes (Signed)
 [name]    CSN: [id number] Arrival date & time: 10/01/23  1714      History   Chief Complaint Chief Complaint  Patient presents with   Vaginal Discomfort     HPI Lisa Booth is a 23 y.o. female.   Patient presents for evaluation of vaginal discharge, mild itching and a strong vaginal odor present for 2 weeks.  Symptoms flared after sexual intercourse with partner, no known exposure.  Endorses history of reoccurring BV.  Has attempted use of cranberry pills and over-the-counter vitamins.  Denies abdominal or flank pain, urinary symptoms.  Endorses that the last 2 occurrences of menstruation have been lighte.   Past Medical History:  Diagnosis Date   Medical history non-contributory     Patient Active Problem List   Diagnosis Date Noted   Normal labor and delivery 08/11/2017   SVD (spontaneous vaginal delivery) 08/11/2017   Fetal cardiac anomaly affecting pregnancy, antepartum 08/01/2017   Supervision of normal first teen pregnancy 07/24/2017   Limited prenatal care in third trimester 07/24/2017   High risk teen pregnancy in third trimester 07/24/2017    Past Surgical History:  Procedure Laterality Date   NO PAST SURGERIES      OB History     Gravida  1   Para  1   Term  1   Preterm      AB      Living  1      SAB      IAB      Ectopic      Multiple  0   Live Births  1            Home Medications    Prior to Admission medications   Medication Sig Start Date End Date Taking? Authorizing Provider  metroNIDAZOLE  (FLAGYL ) 500 MG tablet Take 500 mg by mouth 2 (two) times daily. 06/13/23  Yes [provider]  amoxicillin -clavulanate (AUGMENTIN ) 875-125 MG tablet Take 1 tablet by mouth every 12 (twelve) hours. 05/26/23   Small, Brooke L, PA  doxycycline  (VIBRAMYCIN ) 100 MG capsule Take 1 capsule (100 mg total) by mouth 2 (two) times daily. 05/26/23   Small, Brooke L, PA  etonogestrel  (NEXPLANON ) 68 MG IMPL implant 1 each  by Subdermal route once. Patient not taking: Reported on 05/14/2023    [provider]  triamcinolone  (KENALOG ) 0.025 % cream Apply 1 Application topically 2 (two) times daily. Patient not taking: Reported on 05/14/2023 02/12/23   Lorren Greig PARAS, NP    Family History Family History  Family history unknown: Yes    Social History Social History   Tobacco Use   Smoking status: Some Days    Types: Cigars   Smokeless tobacco: Never  Vaping Use   Vaping status: Some Days   Substances: Nicotine, Flavoring  Substance Use Topics   Alcohol use: Yes    Comment: Socially.   Drug use: Yes    Types: Marijuana    Comment: every other day     Allergies   Patient has no known allergies.   Review of Systems Review of Systems   Physical Exam Triage Vital Signs ED Triage Vitals  Encounter Vitals Group     BP 10/01/23 1739 100/66     Girls Systolic BP Percentile --      Girls Diastolic BP Percentile --      Boys Systolic BP Percentile --      Boys Diastolic BP Percentile --  Pulse Rate 10/01/23 1739 63     Resp 10/01/23 1739 16     Temp 10/01/23 1739 98.7 F (37.1 C)     Temp Source 10/01/23 1739 Oral     SpO2 10/01/23 1739 98 %     Weight 10/01/23 1737 121 lb 0.5 oz (54.9 kg)     Height --      Head Circumference --      Peak Flow --      Pain Score 10/01/23 1735 0     Pain Loc --      Pain Education --      Exclude from Growth Chart --    No data found.  Updated Vital Signs BP 100/66 (BP Location: Left Arm)   Pulse 63   Temp 98.7 F (37.1 C) (Oral)   Resp 16   Wt 121 lb 0.5 oz (54.9 kg)   LMP 09/24/2023 (Exact Date)   SpO2 98%   BMI 20.78 kg/m   Visual Acuity Right Eye Distance:   Left Eye Distance:   Bilateral Distance:    Right Eye Near:   Left Eye Near:    Bilateral Near:     Physical Exam Constitutional:      Appearance: Normal appearance.  Eyes:     Extraocular Movements: Extraocular movements intact.  Pulmonary:     Effort:  Pulmonary effort is normal.  Abdominal:     Tenderness: There is no right CVA tenderness, left CVA tenderness or guarding.  Genitourinary:    Comments: deferred Neurological:     Mental Status: She is alert and oriented to person, place, and time. Mental status is at baseline.      UC Treatments / Results  Labs (all labs ordered are listed, but only abnormal results are displayed) Labs Reviewed  POCT URINE PREGNANCY  CERVICOVAGINAL ANCILLARY ONLY    EKG   Radiology No results found.  Procedures Procedures (including critical care time)  Medications Ordered in UC Medications - No data to display  Initial Impression / Assessment and Plan / UC Course  I have reviewed the triage vital signs and the nursing notes.  Pertinent labs & imaging results that were available during my care of the patient were reviewed by me and considered in my medical decision making (see chart for details).  Vaginal discharge  Treating empirically for BV and yeast, metronidazole  sent to pharmacy advised against alcohol intake during treatment, Diflucan  sent to pharmacy and discussed administration, urine pregnancy negative, discussed findings with patient, advised to use boric acid suppositories after sexual intercourse as a preventative method and if ineffective to follow-up with gynecologist, STI labs pending will treat per protocol, advised abstinence until lab results, and/or treatment is complete, advised condom use during all sexual encounters moving, may follow-up with urgent care as needed  Final Clinical Impressions(s) / UC Diagnoses   Final diagnoses:  None   Discharge Instructions   None    ED Prescriptions   None    PDMP not reviewed this encounter.   Teresa Shelba SAUNDERS, [place] 10/01/23 [phone]

## 2023-10-01 NOTE — Discharge Instructions (Addendum)
 Today you are being treated for  Bacterial vaginosis and yeast    Take Metronidazole  500 mg twice a day for 7 days, do not drink alcohol while using medication, this will make you feel sick   Take 1 Diflucan  tablet when you receive your medicine then take second dose after completion of all antibiotic  Moving forward may take use of boric acid suppositories after sexual intercourse to help reset the pH to prevent BV symptoms  Labs pending 2-3 days, you will be contacted if positive for any sti and treatment will be sent to the pharmacy, you will have to return to the clinic if positive for gonorrhea to receive treatment   Please refrain from having sex until labs results, if positive please refrain from having sex until treatment complete and symptoms resolve   If positive for  Chlamydia  gonorrhea or trichomoniasis please notify partner or partners so they may tested as well  Moving forward, it is recommended you use some form of protection against the transmission of sti infections  such as condoms or dental dams with each sexual encounter     In addition: Avoid baths, hot tubs and whirlpool spas.  Don't use scented or harsh soaps Avoid irritants. These include scented tampons and pads. Wipe from front to back after using the toilet. Don't douche. Your vagina doesn't require cleansing other than normal bathing.  Use a condom.  Wear cotton underwear, this fabric absorbs some moisture.

## 2023-10-01 NOTE — ED Triage Notes (Signed)
 Pt presents c/o vaginal discomfort and discharge x 2 weeks. Pt has tried OTC products but the sxs did not improve.

## 2023-10-02 LAB — CERVICOVAGINAL ANCILLARY ONLY
Bacterial Vaginitis (gardnerella): POSITIVE — AB
Candida Glabrata: NEGATIVE
Candida Vaginitis: POSITIVE — AB
Chlamydia: NEGATIVE
Comment: NEGATIVE
Comment: NEGATIVE
Comment: NEGATIVE
Comment: NEGATIVE
Comment: NEGATIVE
Comment: NORMAL
Neisseria Gonorrhea: NEGATIVE
Trichomonas: NEGATIVE

## 2023-10-03 ENCOUNTER — Ambulatory Visit (HOSPITAL_COMMUNITY): Payer: Self-pay

## 2023-10-30 ENCOUNTER — Encounter: Payer: Self-pay | Admitting: Family

## 2023-12-02 ENCOUNTER — Ambulatory Visit: Payer: Self-pay | Admitting: Dermatology

## 2023-12-03 ENCOUNTER — Ambulatory Visit
Admission: EM | Admit: 2023-12-03 | Discharge: 2023-12-03 | Disposition: A | Discharge status: Home / Self Care | Payer: Self-pay | Attending: Internal Medicine | Admitting: Internal Medicine

## 2023-12-03 ENCOUNTER — Encounter: Payer: Self-pay | Admitting: Emergency Medicine

## 2023-12-03 ENCOUNTER — Ambulatory Visit: Payer: Self-pay

## 2023-12-03 ENCOUNTER — Other Ambulatory Visit: Payer: Self-pay

## 2023-12-03 DIAGNOSIS — Z9189 Other specified personal risk factors, not elsewhere classified: Secondary | ICD-10-CM | POA: Insufficient documentation

## 2023-12-03 DIAGNOSIS — Z3202 Encounter for pregnancy test, result negative: Secondary | ICD-10-CM | POA: Insufficient documentation

## 2023-12-03 LAB — POCT URINE PREGNANCY: Preg Test, Ur: NEGATIVE

## 2023-12-03 NOTE — ED Triage Notes (Signed)
 Pt requesting STD testing and pregnancy test; pt sts LMP was 11/13/23 and denies sx

## 2023-12-03 NOTE — ED Provider Notes (Signed)
 [name]    CSN: [id number] Arrival date & time: 12/03/23  1509      History   Chief Complaint Chief Complaint  Patient presents with   Exposure to STD    HPI Lisa Booth is a 23 y.o. female.   Lisa Booth is a 23 y.o. female presenting to urgent care requesting STI testing.  Currently asymptomatic and without recent known STI exposure.  Sexually active with single female partner(s) without protection. Partner was unfaithful and no known exposures.  Denies urinary symptoms, N/V/D, pelvic/abdominal pain, new low back pain, fever/chills.  No vaginal discharge, odor, or itch.  LMP 11/13/2023, does not use contraception currently.    Exposure to STD    Past Medical History:  Diagnosis Date   Medical history non-contributory     Patient Active Problem List   Diagnosis Date Noted   Normal labor and delivery 08/11/2017   SVD (spontaneous vaginal delivery) 08/11/2017   Fetal cardiac anomaly affecting pregnancy, antepartum 08/01/2017   Supervision of normal first teen pregnancy 07/24/2017   Limited prenatal care in third trimester 07/24/2017   High risk teen pregnancy in third trimester 07/24/2017    Past Surgical History:  Procedure Laterality Date   NO PAST SURGERIES      OB History     Gravida  1   Para  1   Term  1   Preterm      AB      Living  1      SAB      IAB      Ectopic      Multiple  0   Live Births  1            Home Medications    Prior to Admission medications   Medication Sig Start Date End Date Taking? Authorizing Provider  amoxicillin -clavulanate (AUGMENTIN ) 875-125 MG tablet Take 1 tablet by mouth every 12 (twelve) hours. 05/26/23   Small, Brooke L, PA  doxycycline  (VIBRAMYCIN ) 100 MG capsule Take 1 capsule (100 mg total) by mouth 2 (two) times daily. 05/26/23   Small, Brooke L, PA  etonogestrel  (NEXPLANON ) 68 MG IMPL implant 1 each by Subdermal route once. Patient not taking: Reported on 05/14/2023     [provider]  metroNIDAZOLE  (FLAGYL ) 500 MG tablet Take 500 mg by mouth 2 (two) times daily. 06/13/23   [provider]  metroNIDAZOLE  (FLAGYL ) 500 MG tablet Take 1 tablet (500 mg total) by mouth 2 (two) times daily. Patient not taking: Reported on 12/03/2023 10/01/23   Teresa Shelba SAUNDERS, NP  triamcinolone  (KENALOG ) 0.025 % cream Apply 1 Application topically 2 (two) times daily. Patient not taking: Reported on 05/14/2023 02/12/23   Lorren Greig PARAS, NP    Family History Family History  Family history unknown: Yes    Social History Social History   Tobacco Use   Smoking status: Some Days    Types: Cigars   Smokeless tobacco: Never  Vaping Use   Vaping status: Some Days   Substances: Nicotine, Flavoring  Substance Use Topics   Alcohol use: Yes    Comment: Socially.   Drug use: Yes    Types: Marijuana    Comment: every other day     Allergies   Patient has no known allergies.   Review of Systems Review of Systems Per HPI  Physical Exam Triage Vital Signs ED Triage Vitals [12/03/23 1527]  Encounter Vitals Group     BP (!) 121/58  Girls Systolic BP Percentile      Girls Diastolic BP Percentile      Boys Systolic BP Percentile      Boys Diastolic BP Percentile      Pulse Rate 82     Resp 18     Temp 98.1 F (36.7 C)     Temp Source Oral     SpO2 98 %     Weight      Height      Head Circumference      Peak Flow      Pain Score 0     Pain Loc      Pain Education      Exclude from Growth Chart    No data found.  Updated Vital Signs BP (!) 121/58 (BP Location: Left Arm)   Pulse 82   Temp 98.1 F (36.7 C) (Oral)   Resp 18   LMP 11/13/2023 (Exact Date)   SpO2 98%   Visual Acuity Right Eye Distance:   Left Eye Distance:   Bilateral Distance:    Right Eye Near:   Left Eye Near:    Bilateral Near:     Physical Exam Vitals and nursing note reviewed.  Constitutional:      Appearance: She is not ill-appearing or toxic-appearing.   HENT:     Head: Normocephalic and atraumatic.     Right Ear: Hearing and external ear normal.     Left Ear: Hearing and external ear normal.     Nose: Nose normal.     Mouth/Throat:     Lips: Pink.  Eyes:     General: Lids are normal. Vision grossly intact. Gaze aligned appropriately.     Extraocular Movements: Extraocular movements intact.     Conjunctiva/sclera: Conjunctivae normal.  Pulmonary:     Effort: Pulmonary effort is normal.  Musculoskeletal:     Cervical back: Neck supple.  Skin:    General: Skin is warm and dry.     Capillary Refill: Capillary refill takes less than 2 seconds.     Findings: No rash.  Neurological:     General: No focal deficit present.     Mental Status: She is alert and oriented to person, place, and time. Mental status is at baseline.     Cranial Nerves: No dysarthria or facial asymmetry.  Psychiatric:        Mood and Affect: Mood normal.        Speech: Speech normal.        Behavior: Behavior normal.        Thought Content: Thought content normal.        Judgment: Judgment normal.      UC Treatments / Results  Labs (all labs ordered are listed, but only abnormal results are displayed) Labs Reviewed  POCT URINE PREGNANCY - Normal  RPR  HIV ANTIBODY (ROUTINE TESTING W REFLEX)  CERVICOVAGINAL ANCILLARY ONLY    EKG   Radiology No results found.  Procedures Procedures (including critical care time)  Medications Ordered in UC Medications - No data to display  Initial Impression / Assessment and Plan / UC Course  I have reviewed the triage vital signs and the nursing notes.  Pertinent labs & imaging results that were available during my care of the patient were reviewed by me and considered in my medical decision making (see chart for details).   1. At risk for sexually transmitted disease due to unprotected sex STI labs pending, will notify patient of  positive results and treat accordingly per protocol when labs result.   Patient agrees to HIV and syphilis testing today.   Patient to avoid sexual intercourse until screening testing comes back.   Education provided regarding safe sexual practices and patient encouraged to use protection to prevent spread of STIs.  Urine pregnancy test is negative.  Counseled patient on potential for adverse effects with medications prescribed/recommended today, strict ER and return-to-clinic precautions discussed, patient verbalized understanding.    Final Clinical Impressions(s) / UC Diagnoses   Final diagnoses:  At risk for sexually transmitted disease due to unprotected sex     Discharge Instructions      STD testing pending, this will take 2-3 days to result. We will only call you if your testing is positive for any infection(s) and we will provide treatment.  Avoid sexual intercourse until your STD results come back.  If any of your STD results are positive, you will need to avoid sexual intercourse for 7 days while you are being treated to prevent spread of STD.  Condom use is the best way to prevent spread of STDs. Notify partner(s) of any positive results.  Return to urgent care as needed.      ED Prescriptions   None    PDMP not reviewed this encounter.   Enedelia Dorna HERO, [place] 12/03/23 1554

## 2023-12-03 NOTE — Discharge Instructions (Addendum)

## 2023-12-04 LAB — HIV ANTIBODY (ROUTINE TESTING W REFLEX): HIV Screen 4th Generation wRfx: NONREACTIVE

## 2023-12-04 LAB — CERVICOVAGINAL ANCILLARY ONLY
Chlamydia: NEGATIVE
Comment: NEGATIVE
Comment: NEGATIVE
Comment: NORMAL
Neisseria Gonorrhea: NEGATIVE
Trichomonas: NEGATIVE

## 2023-12-04 LAB — RPR: RPR Ser Ql: NONREACTIVE

## 2024-03-02 ENCOUNTER — Ambulatory Visit: Payer: Self-pay | Admitting: Dermatology

## 2024-10-12 ENCOUNTER — Ambulatory Visit: Payer: Self-pay | Admitting: Physician Assistant
